# Patient Record
Sex: Female | Born: 1985
Health system: Southern US, Community
[De-identification: ages and names within clinical notes are randomized; demographics above are authoritative.]

## PROBLEM LIST (undated history)

## (undated) ENCOUNTER — Inpatient Hospital Stay: Payer: Self-pay

## (undated) DIAGNOSIS — R519 Headache, unspecified: Secondary | ICD-10-CM

## (undated) DIAGNOSIS — O10919 Unspecified pre-existing hypertension complicating pregnancy, unspecified trimester: Secondary | ICD-10-CM

## (undated) DIAGNOSIS — F419 Anxiety disorder, unspecified: Secondary | ICD-10-CM

## (undated) DIAGNOSIS — I10 Essential (primary) hypertension: Secondary | ICD-10-CM

## (undated) DIAGNOSIS — R51 Headache: Secondary | ICD-10-CM

## (undated) DIAGNOSIS — E119 Type 2 diabetes mellitus without complications: Secondary | ICD-10-CM

## (undated) HISTORY — DX: Essential (primary) hypertension: I10

## (undated) HISTORY — DX: Anxiety disorder, unspecified: F41.9

## (undated) HISTORY — PX: NO PAST SURGERIES: SHX2092

## (undated) HISTORY — DX: Unspecified pre-existing hypertension complicating pregnancy, unspecified trimester: O10.919

## (undated) HISTORY — DX: Type 2 diabetes mellitus without complications: E11.9

---
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
## 2011-10-10 ENCOUNTER — Other Ambulatory Visit (HOSPITAL_COMMUNITY)
Admission: RE | Admit: 2011-10-10 | Discharge: 2011-10-10 | Disposition: A | Discharge status: Home / Self Care | Payer: BC Managed Care – PPO | Source: Ambulatory Visit | Attending: Gynecology | Admitting: Gynecology

## 2011-10-10 ENCOUNTER — Ambulatory Visit (INDEPENDENT_AMBULATORY_CARE_PROVIDER_SITE_OTHER): Payer: BC Managed Care – PPO | Admitting: Gynecology

## 2011-10-10 ENCOUNTER — Encounter: Payer: Self-pay | Admitting: Gynecology

## 2011-10-10 VITALS — BP 132/78 | Ht 64.0 in | Wt 193.0 lb

## 2011-10-10 DIAGNOSIS — R202 Paresthesia of skin: Secondary | ICD-10-CM | POA: Insufficient documentation

## 2011-10-10 DIAGNOSIS — Z23 Encounter for immunization: Secondary | ICD-10-CM

## 2011-10-10 DIAGNOSIS — R319 Hematuria, unspecified: Secondary | ICD-10-CM

## 2011-10-10 DIAGNOSIS — R209 Unspecified disturbances of skin sensation: Secondary | ICD-10-CM

## 2011-10-10 DIAGNOSIS — Z01419 Encounter for gynecological examination (general) (routine) without abnormal findings: Secondary | ICD-10-CM | POA: Insufficient documentation

## 2011-10-10 DIAGNOSIS — G8929 Other chronic pain: Secondary | ICD-10-CM | POA: Insufficient documentation

## 2011-10-10 DIAGNOSIS — R635 Abnormal weight gain: Secondary | ICD-10-CM | POA: Insufficient documentation

## 2011-10-10 DIAGNOSIS — Z113 Encounter for screening for infections with a predominantly sexual mode of transmission: Secondary | ICD-10-CM

## 2011-10-10 DIAGNOSIS — D649 Anemia, unspecified: Secondary | ICD-10-CM

## 2011-10-10 DIAGNOSIS — I10 Essential (primary) hypertension: Secondary | ICD-10-CM

## 2011-10-10 DIAGNOSIS — R51 Headache: Secondary | ICD-10-CM

## 2011-10-10 LAB — COMPREHENSIVE METABOLIC PANEL
ALT: 17 U/L (ref 0–35)
BUN: 10 mg/dL (ref 6–23)
CO2: 25 mEq/L (ref 19–32)
Calcium: 9.8 mg/dL (ref 8.4–10.5)
Chloride: 104 mEq/L (ref 96–112)
Creat: 0.9 mg/dL (ref 0.50–1.10)
Total Bilirubin: 0.4 mg/dL (ref 0.3–1.2)

## 2011-10-10 LAB — CHOLESTEROL, TOTAL: Cholesterol: 128 mg/dL (ref 0–200)

## 2011-10-10 LAB — CBC WITH DIFFERENTIAL/PLATELET
Basophils Relative: 0 % (ref 0–1)
Eosinophils Absolute: 0 10*3/uL (ref 0.0–0.7)
Eosinophils Relative: 0 % (ref 0–5)
Hemoglobin: 12.8 g/dL (ref 12.0–15.0)
Lymphs Abs: 3.1 10*3/uL (ref 0.7–4.0)
MCH: 24.1 pg — ABNORMAL LOW (ref 26.0–34.0)
MCHC: 33 g/dL (ref 30.0–36.0)
MCV: 73.1 fL — ABNORMAL LOW (ref 78.0–100.0)
Monocytes Relative: 9 % (ref 3–12)
Neutrophils Relative %: 61 % (ref 43–77)
Platelets: 291 10*3/uL (ref 150–400)
RBC: 5.31 MIL/uL — ABNORMAL HIGH (ref 3.87–5.11)

## 2011-10-10 LAB — TSH: TSH: 1.105 u[IU]/mL (ref 0.350–4.500)

## 2011-10-10 MED ORDER — NORGESTIM-ETH ESTRAD TRIPHASIC 0.18/0.215/0.25 MG-35 MCG PO TABS: 1.0000 | ORAL_TABLET | Freq: Every day | ORAL | Status: DC
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
## 2011-10-10 NOTE — Patient Instructions (Addendum)
Breast Self-Examination You should begin examining your breasts at age 24 even though the risk for breast cancer is low in this age group. It is important to become familiar with how your breasts look and feel. This is true for pregnant women, nursing mothers, women in menopause and women who have breast implants.  Women should examine their breasts once a month to look for changes and lumps. By doing monthly breast exams, you get to know how your breasts feel and how they can change from month to month. This allows you to pick up changes early. It can also offer you some reassurance that your breast health is good. This exam only takes minutes. Most breast lumps are not caused by cancer. If you find a lump, a special x-ray called a mammogram, or other tests may be needed to determine what is wrong.  Some of the signs that a breast lump is caused by cancer include:  Dimpling of the skin or changes in the shape of the breast or nipple.   A dark-colored or bloody discharge from the nipple.   Swollen lymph glands around the breast or in the armpit.   Redness of the breast or nipple.   Scaly nipple or skin on the breast.   Pain or swelling of the breast.  SELF-EXAM There are a few points to follow when doing a thorough breast exam. The best time to examine your breasts is 5 to 7 days after the menstrual period is over. During menstruation, the breasts are lumpier, and it may be more difficult to pick up changes. If you do not menstruate, have reached menopause or had a hysterectomy, examine your breasts the first day of every month. After three to four months, you will become more familiar with the variations of your breasts and more comfortable with the exam.  Perform your breast exam monthly. Keep a written record with breast changes or normal findings for each breast. This makes it easier to be sure of changes and to not solely depend on memory for size, tenderness, or location. Try to do the exam  at the same time each month, and write down where you are in your menstrual cycle if you are still menstruating.   Look at your breasts. Stand in front of a mirror with your hands clasped behind your head. Tighten your chest muscles and look for asymmetry. This means a difference in shape or contour from one breast to the other, such as puckers, dips or bumps. Look also for skin changes.   Lean forward with your hands on your hips. Again, look for symmetry and skin changes.   While showering, soap the breasts, and carefully feel the breasts with fingertips while holding the arm (on the side of the breast being examined) over the head. Do this with each breast carefully feeling for lumps or changes. Typically, a circular motion with moderate fingertip pressure should be used.   Repeat this exam while lying on your back, again with your arm behind your head and a pillow under your shoulders. Again, use your fingertips to examine both breasts, feeling for lumps and thickening. Begin at 1 o'clock and go clockwise around the whole breast.   At the end of your exam, gently squeeze each nipple to see if there is any drainage. Look for nipple changes, dimpling or redness.   Lastly, examine the upper chest and clavicle areas and in your armpits.  It is not necessary to become alarmed if you find   a breast lump. Most of them are not cancerous. However, it is necessary to see your caregiver if a lump is found in order to have it looked at. Document Released: 04/26/2004 Document Revised: 11/29/2010 Document Reviewed: 07/06/2008 [facility] Patient Information 2012 [place], [place].  Headaches, Frequently Asked Questions MIGRAINE HEADACHES Q: What is migraine? What causes it? How can I treat it? A: Generally, migraine headaches begin as a dull ache. Then they develop into a constant, throbbing, and pulsating pain. You may experience pain at the temples. You may experience pain at the front or back of one or both  sides of the head. The pain is usually accompanied by a combination of:  Nausea.   Vomiting.   Sensitivity to light and noise.  Some people (about 15%) experience an aura (see below) before an attack. The cause of migraine is believed to be chemical reactions in the brain. Treatment for migraine may include over-the-counter or prescription medications. It may also include self-help techniques. These include relaxation training and biofeedback.  Q: What is an aura? A: About 15% of people with migraine get an "aura". This is a sign of neurological symptoms that occur before a migraine headache. You may see wavy or jagged lines, dots, or flashing lights. You might experience tunnel vision or blind spots in one or both eyes. The aura can include visual or auditory hallucinations (something imagined). It may include disruptions in smell (such as strange odors), taste or touch. Other symptoms include:  Numbness.   A "pins and needles" sensation.   Difficulty in recalling or speaking the correct word.  These neurological events may last as long as 60 minutes. These symptoms will fade as the headache begins. Q: What is a trigger? A: Certain physical or environmental factors can lead to or "trigger" a migraine. These include:  Foods.   Hormonal changes.   Weather.   Stress.  It is important to remember that triggers are different for everyone. To help prevent migraine attacks, you need to figure out which triggers affect you. Keep a headache diary. This is a good way to track triggers. The diary will help you talk to your healthcare professional about your condition. Q: Does weather affect migraines? A: Bright sunshine, hot, humid conditions, and drastic changes in barometric pressure may lead to, or "trigger," a migraine attack in some people. But studies have shown that weather does not act as a trigger for everyone with migraines. Q: What is the link between migraine and hormones? A:  Hormones start and regulate many of your body's functions. Hormones keep your body in balance within a constantly changing environment. The levels of hormones in your body are unbalanced at times. Examples are during menstruation, pregnancy, or menopause. That can lead to a migraine attack. In fact, about three quarters of all women with migraine report that their attacks are related to the menstrual cycle.  Q: Is there an increased risk of stroke for migraine sufferers? A: The likelihood of a migraine attack causing a stroke is very remote. That is not to say that migraine sufferers cannot have a stroke associated with their migraines. In persons under age 70, the most common associated factor for stroke is migraine headache. But over the course of a person's normal life span, the occurrence of migraine headache may actually be associated with a reduced risk of dying from cerebrovascular disease due to stroke.  Q: What are acute medications for migraine? A: Acute medications are used to treat the pain of  the headache after it has started. Examples over-the-counter medications, NSAIDs, ergots, and triptans.  Q: What are the triptans? A: Triptans are the newest class of abortive medications. They are specifically targeted to treat migraine. Triptans are vasoconstrictors. They moderate some chemical reactions in the brain. The triptans work on receptors in your brain. Triptans help to restore the balance of a neurotransmitter called serotonin. Fluctuations in levels of serotonin are thought to be a main cause of migraine.  Q: Are over-the-counter medications for migraine effective? A: Over-the-counter, or "OTC," medications may be effective in relieving mild to moderate pain and associated symptoms of migraine. But you should see your caregiver before beginning any treatment regimen for migraine.  Q: What are preventive medications for migraine? A: Preventive medications for migraine are sometimes referred  to as "prophylactic" treatments. They are used to reduce the frequency, severity, and length of migraine attacks. Examples of preventive medications include antiepileptic medications, antidepressants, beta-blockers, calcium channel blockers, and NSAIDs (nonsteroidal anti-inflammatory drugs). Q: Why are anticonvulsants used to treat migraine? A: During the past few years, there has been an increased interest in antiepileptic drugs for the prevention of migraine. They are sometimes referred to as "anticonvulsants". Both epilepsy and migraine may be caused by similar reactions in the brain.  Q: Why are antidepressants used to treat migraine? A: Antidepressants are typically used to treat people with depression. They may reduce migraine frequency by regulating chemical levels, such as serotonin, in the brain.  Q: What alternative therapies are used to treat migraine? A: The term "alternative therapies" is often used to describe treatments considered outside the scope of conventional Western medicine. Examples of alternative therapy include acupuncture, acupressure, and yoga. Another common alternative treatment is herbal therapy. Some herbs are believed to relieve headache pain. Always discuss alternative therapies with your caregiver before proceeding. Some herbal products contain arsenic and other toxins. TENSION HEADACHES Q: What is a tension-type headache? What causes it? How can I treat it? A: Tension-type headaches occur randomly. They are often the result of temporary stress, anxiety, fatigue, or anger. Symptoms include soreness in your temples, a tightening band-like sensation around your head (a "vice-like" ache). Symptoms can also include a pulling feeling, pressure sensations, and contracting head and neck muscles. The headache begins in your forehead, temples, or the back of your head and neck. Treatment for tension-type headache may include over-the-counter or prescription medications. Treatment  may also include self-help techniques such as relaxation training and biofeedback. CLUSTER HEADACHES Q: What is a cluster headache? What causes it? How can I treat it? A: Cluster headache gets its name because the attacks come in groups. The pain arrives with little, if any, warning. It is usually on one side of the head. A tearing or bloodshot eye and a runny nose on the same side of the headache may also accompany the pain. Cluster headaches are believed to be caused by chemical reactions in the brain. They have been described as the most severe and intense of any headache type. Treatment for cluster headache includes prescription medication and oxygen. SINUS HEADACHES Q: What is a sinus headache? What causes it? How can I treat it? A: When a cavity in the bones of the face and skull (a sinus) becomes inflamed, the inflammation will cause localized pain. This condition is usually the result of an allergic reaction, a tumor, or an infection. If your headache is caused by a sinus blockage, such as an infection, you will probably have a fever. An x-ray  will confirm a sinus blockage. Your caregiver's treatment might include antibiotics for the infection, as well as antihistamines or decongestants.  REBOUND HEADACHES Q: What is a rebound headache? What causes it? How can I treat it? A: A pattern of taking acute headache medications too often can lead to a condition known as "rebound headache." A pattern of taking too much headache medication includes taking it more than 2 days per week or in excessive amounts. That means more than the label or a caregiver advises. With rebound headaches, your medications not only stop relieving pain, they actually begin to cause headaches. Doctors treat rebound headache by tapering the medication that is being overused. Sometimes your caregiver will gradually substitute a different type of treatment or medication. Stopping may be a challenge. Regularly overusing a medication  increases the potential for serious side effects. Consult a caregiver if you regularly use headache medications more than 2 days per week or more than the label advises. ADDITIONAL QUESTIONS AND ANSWERS Q: What is biofeedback? A: Biofeedback is a self-help treatment. Biofeedback uses special equipment to monitor your body's involuntary physical responses. Biofeedback monitors:  Breathing.   Pulse.   Heart rate.   Temperature.   Muscle tension.   Brain activity.  Biofeedback helps you refine and perfect your relaxation exercises. You learn to control the physical responses that are related to stress. Once the technique has been mastered, you do not need the equipment any more. Q: Are headaches hereditary? A: Four out of five (80%) of people that suffer report a family history of migraine. Scientists are not sure if this is genetic or a family predisposition. Despite the uncertainty, a child has a 50% chance of having migraine if one parent suffers. The child has a 75% chance if both parents suffer.  Q: Can children get headaches? A: By the time they reach high school, most young people have experienced some type of headache. Many safe and effective approaches or medications can prevent a headache from occurring or stop it after it has begun.  Q: What type of doctor should I see to diagnose and treat my headache? A: Start with your primary caregiver. Discuss his or her experience and approach to headaches. Discuss methods of classification, diagnosis, and treatment. Your caregiver may decide to recommend you to a headache specialist, depending upon your symptoms or other physical conditions. Having diabetes, allergies, etc., may require a more comprehensive and inclusive approach to your headache. The National Headache Foundation will provide, upon request, a list of [facility] physician members in your state. Document Released: 06/09/2003 Document Revised: 03/08/2011 Document Reviewed:  11/17/2007 [facility] Patient Information 2012 [place], [place].

## 2011-10-11 ENCOUNTER — Telehealth: Payer: Self-pay

## 2011-10-11 LAB — GC/CHLAMYDIA PROBE AMP, GENITAL
Chlamydia, DNA Probe: NEGATIVE
GC Probe Amp, Genital: NEGATIVE

## 2011-10-11 LAB — URINALYSIS W MICROSCOPIC + REFLEX CULTURE
Leukocytes, UA: NEGATIVE
Nitrite: NEGATIVE
Protein, ur: NEGATIVE mg/dL
Squamous Epithelial / LPF: NONE SEEN
Urobilinogen, UA: 0.2 mg/dL (ref 0.0–1.0)

## 2011-10-11 NOTE — Addendum Note (Signed)
Addended by: Venora Maples on: 10/11/2011 03:53 PM   Modules accepted: Orders

## 2011-10-11 NOTE — Telephone Encounter (Signed)
APPT IS FOR 11-19-11 AT 10:15.

## 2011-10-11 NOTE — Telephone Encounter (Signed)
   Ok Edwards, MD << Less Detail     Ok Edwards, MD       Sent: Wed October 10, 2011  5:10 PM    To: Amy Duwaine Maxin, CNA       Brittany White    MRN: [id number] DOB: 12/23/1985    Pt Home: [phone]               Message     Amy, please make appointment for this patient with Dr. Vela Prose neurologist. Please see encountered note for details from today's visit so that we can send him. Thank you

## 2011-10-12 MED ORDER — FLUCONAZOLE 100 MG PO TABS: 100.0000 mg | ORAL_TABLET | Freq: Once | ORAL | Status: AC

## 2011-10-12 NOTE — Addendum Note (Signed)
Addended by: Venora Maples on: 10/12/2011 02:48 PM   Modules accepted: Orders

## 2011-10-12 NOTE — Telephone Encounter (Signed)
PT. NOTIFIED OF APPT. AND RECORDS FAXED TO DR. Clarisse Gouge.

## 2011-10-17 ENCOUNTER — Ambulatory Visit: Payer: BC Managed Care – PPO | Admitting: Gynecology

## 2011-10-17 DIAGNOSIS — R319 Hematuria, unspecified: Secondary | ICD-10-CM

## 2011-10-17 DIAGNOSIS — D649 Anemia, unspecified: Secondary | ICD-10-CM

## 2011-10-17 LAB — IRON AND TIBC
%SAT: 30 % (ref 20–55)
TIBC: 351 ug/dL (ref 250–470)
UIBC: 246 ug/dL (ref 125–400)

## 2011-10-17 LAB — FERRITIN: Ferritin: 98 ng/mL (ref 10–291)

## 2011-10-18 LAB — URINALYSIS W MICROSCOPIC + REFLEX CULTURE
Glucose, UA: NEGATIVE mg/dL
Leukocytes, UA: NEGATIVE
Nitrite: NEGATIVE
Protein, ur: NEGATIVE mg/dL
Urobilinogen, UA: 0.2 mg/dL (ref 0.0–1.0)

## 2011-10-19 ENCOUNTER — Other Ambulatory Visit: Payer: Self-pay | Admitting: Gynecology

## 2011-10-19 MED ORDER — NITROFURANTOIN MONOHYD MACRO 100 MG PO CAPS: 100.0000 mg | ORAL_CAPSULE | Freq: Two times a day (BID) | ORAL | Status: AC

## 2011-10-20 LAB — URINE CULTURE

## 2011-12-04 ENCOUNTER — Other Ambulatory Visit: Payer: Self-pay | Admitting: Neurology

## 2011-12-04 DIAGNOSIS — R200 Anesthesia of skin: Secondary | ICD-10-CM

## 2011-12-12 ENCOUNTER — Ambulatory Visit
Admission: RE | Admit: 2011-12-12 | Discharge: 2011-12-12 | Disposition: A | Discharge status: Home / Self Care | Payer: BC Managed Care – PPO | Source: Ambulatory Visit | Attending: Neurology | Admitting: Neurology

## 2011-12-12 DIAGNOSIS — R200 Anesthesia of skin: Secondary | ICD-10-CM

## 2012-06-04 ENCOUNTER — Emergency Department: Payer: Self-pay

## 2012-06-04 LAB — URINALYSIS, COMPLETE
Bilirubin,UR: NEGATIVE
Leukocyte Esterase: NEGATIVE
Nitrite: NEGATIVE
Ph: 5 (ref 4.5–8.0)
RBC,UR: 2 /HPF (ref 0–5)
Specific Gravity: 1.019 (ref 1.003–1.030)
Squamous Epithelial: 6

## 2012-06-04 LAB — CBC
HGB: 12.7 g/dL (ref 12.0–16.0)
MCH: 23.8 pg — ABNORMAL LOW (ref 26.0–34.0)
MCHC: 31.5 g/dL — ABNORMAL LOW (ref 32.0–36.0)
MCV: 76 fL — ABNORMAL LOW (ref 80–100)
Platelet: 253 10*3/uL (ref 150–440)
RDW: 14.4 % (ref 11.5–14.5)

## 2012-06-04 LAB — BASIC METABOLIC PANEL
Calcium, Total: 8.3 mg/dL — ABNORMAL LOW (ref 8.5–10.1)
Chloride: 109 mmol/L — ABNORMAL HIGH (ref 98–107)
Creatinine: 0.91 mg/dL (ref 0.60–1.30)
EGFR (African American): >60
Sodium: 138 mmol/L (ref 136–145)

## 2012-06-04 LAB — WET PREP, GENITAL

## 2013-01-29 ENCOUNTER — Telehealth: Payer: Self-pay | Admitting: Internal Medicine

## 2013-01-29 NOTE — Telephone Encounter (Signed)
Pt is scheduled for a new pt apptmt w/you on 02/10/2013, but is having some stomach pain, vomiting, and fatigue.  She hasn't had a menstrual in 8 months due to her birth control.  However, she does suspect poss pregnancy.  She's taken an OTC pregnancy test which was negative, but due to her current symptoms, she's wondering if it was correct or not.  She wants to be seen sooner than 02/10/2013.  Can you accommodate her an apptmt prior to 11/11? Thank you.

## 2013-01-29 NOTE — Telephone Encounter (Signed)
Can she do 11/4 at 2:00 that is the only time I can work her in

## 2013-01-29 NOTE — Telephone Encounter (Signed)
Have her make and acute only visit and I will do her new pt stuff on the 11th

## 2013-01-29 NOTE — Telephone Encounter (Signed)
Pt says that means 2 copays and it also means time away from work, which would cause financial hardship for her.  She wants to know if you can do everything in one visit prior to 02/10/2013. Can you accommodate this? Thank you.

## 2013-01-30 NOTE — Telephone Encounter (Signed)
Pt scheduled for 02/03/2013 @ 2:00 p.m.

## 2013-02-03 ENCOUNTER — Encounter: Payer: Self-pay | Admitting: Internal Medicine

## 2013-02-03 ENCOUNTER — Ambulatory Visit (INDEPENDENT_AMBULATORY_CARE_PROVIDER_SITE_OTHER): Payer: Managed Care, Other (non HMO) | Admitting: Internal Medicine

## 2013-02-03 VITALS — BP 112/70 | HR 74 | Temp 98.1°F | Ht 64.0 in | Wt 215.8 lb

## 2013-02-03 DIAGNOSIS — R635 Abnormal weight gain: Secondary | ICD-10-CM

## 2013-02-03 DIAGNOSIS — R5381 Other malaise: Secondary | ICD-10-CM

## 2013-02-03 DIAGNOSIS — Z0000 Encounter for general adult medical examination without abnormal findings: Secondary | ICD-10-CM

## 2013-02-03 DIAGNOSIS — Z13220 Encounter for screening for lipoid disorders: Secondary | ICD-10-CM

## 2013-02-03 DIAGNOSIS — G43909 Migraine, unspecified, not intractable, without status migrainosus: Secondary | ICD-10-CM

## 2013-02-03 DIAGNOSIS — G44209 Tension-type headache, unspecified, not intractable: Secondary | ICD-10-CM | POA: Insufficient documentation

## 2013-02-03 DIAGNOSIS — R110 Nausea: Secondary | ICD-10-CM

## 2013-02-03 LAB — LIPID PANEL
Cholesterol: 133 mg/dL (ref 0–200)
LDL Cholesterol: 81 mg/dL (ref 0–99)
Total CHOL/HDL Ratio: 3
Triglycerides: 48 mg/dL (ref 0.0–149.0)

## 2013-02-03 LAB — COMPREHENSIVE METABOLIC PANEL
ALT: 22 U/L (ref 0–35)
Albumin: 4.1 g/dL (ref 3.5–5.2)
Alkaline Phosphatase: 95 U/L (ref 39–117)
BUN: 8 mg/dL (ref 6–23)
Calcium: 9.2 mg/dL (ref 8.4–10.5)
Chloride: 105 mEq/L (ref 96–112)
Glucose, Bld: 127 mg/dL — ABNORMAL HIGH (ref 70–99)
Potassium: 4 mEq/L (ref 3.5–5.1)
Total Protein: 7.4 g/dL (ref 6.0–8.3)

## 2013-02-03 LAB — CBC
Hemoglobin: 12.3 g/dL (ref 12.0–15.0)
MCV: 74.2 fl — ABNORMAL LOW (ref 78.0–100.0)
Platelets: 266 10*3/uL (ref 150.0–400.0)
RBC: 5.1 Mil/uL (ref 3.87–5.11)

## 2013-02-03 MED ORDER — IBUPROFEN 800 MG PO TABS: 800.0000 mg | ORAL_TABLET | Freq: Three times a day (TID) | ORAL | Status: DC | PRN

## 2013-02-03 NOTE — Assessment & Plan Note (Signed)
Hormonal Better on OCP Will refill Advil today

## 2013-02-03 NOTE — Patient Instructions (Signed)

## 2013-02-03 NOTE — Progress Notes (Signed)
HPI Pt presents to the clinic today to establish care. [facility]. She had a physical in 05/2012. She does have some concerns today. 1- She c/o fatigue, excessive sleepiness. This has been going on for the last month. She is not sleeping with TV, music or nightlight on. She has never had issues with sleep in the past. She has not tried anything OTC.  2- She c/o intermittent nausea. This started about 2 weeks. She has vomited, clear fluid. Typically, this occurs around 10 am or 11. She denies any specific triggers. 3- she c/o of excessive appetite. This started about 2 months ago. She has gained 5-6 lb weight gain. She is not working. 4- She is concerned that she may be pregnant. She is on [place]. She has been taking it for the last 8 months. She has not had a period since that time. She has not missed any pills. She has done urine preg test OTC which have been negative. She is sexually active.  Flu: never HPV: has had 2 out of 3 (2013) Tetanus: unsure LMP: 05/2012 Pap Smear: 05/2012 Dentist: biannually  Past Medical History  Diagnosis Date  . Anxiety   . Hypertension     Current Outpatient Prescriptions  Medication Sig Dispense Refill  . ibuprofen (ADVIL,MOTRIN) 800 MG tablet Take 800 mg by mouth every 8 (eight) hours as needed.      . Norgestimate-Ethinyl Estradiol Triphasic (ORTHO TRI-CYCLEN, 28,) 0.18/0.215/0.25 MG-35 MCG tablet Take 1 tablet by mouth daily.  1 Package  11   No current facility-administered medications for this visit.    No Known Allergies  Family History  Problem Relation Age of Onset  . Heart disease Maternal Aunt   . Heart disease Maternal Grandmother   . Hypertension Paternal Grandmother   . Heart disease Paternal Grandmother   . Diabetes Paternal Grandmother   . Hypertension Paternal Grandfather   . Cancer Paternal Grandfather     PROSTATE    History   Social History  . Marital Status: Single    Spouse Name:  N/A    Number of Children: N/A  . Years of Education: N/A   Occupational History  . Not on file.   Social History Main Topics  . Smoking status: Never Smoker   . Smokeless tobacco: Never Used  . Alcohol Use: No  . Drug Use: No  . Sexual Activity: Yes    Birth Control/ Protection: Pill     Comment: ORTHO TRI CYLCLEN   Other Topics Concern  . Not on file   Social History Narrative  . No narrative on file    ROS:  Constitutional: Denies fever, malaise, headache or abrupt weight changes.  HEENT: Denies eye pain, eye redness, ear pain, ringing in the ears, wax buildup, runny nose, nasal congestion, bloody nose, or sore throat. Respiratory: Denies difficulty breathing, shortness of breath, cough or sputum production.   Cardiovascular: Denies chest pain, chest tightness, palpitations or swelling in the hands or feet.  Gastrointestinal: Denies abdominal pain, bloating, constipation, diarrhea or blood in the stool.  GU: Denies frequency, urgency, pain with urination, blood in urine, odor or discharge. Musculoskeletal: Denies decrease in range of motion, difficulty with gait, muscle pain or joint pain and swelling.  Skin: Denies redness, rashes, lesions or ulcercations.  Neurological: Denies dizziness, difficulty with memory, difficulty with speech or problems with balance and coordination.   No other specific complaints in a complete review of systems (except as listed  in HPI above).  PE:  BP 112/70  Pulse 74  Temp(Src) 98.1 F (36.7 C) (Oral)  Ht 5\' 4"  (1.626 m)  Wt 215 lb 12 oz (97.864 kg)  BMI 37.02 kg/m2  SpO2 99%  LMP 05/02/2012 Wt Readings from Last 3 Encounters:  02/03/13 215 lb 12 oz (97.864 kg)  10/10/11 193 lb (87.544 kg)    General: Appears her stated age, obese but well developed, well nourished in NAD. HEENT: Head: normal shape and size; Eyes: sclera white, no icterus, conjunctiva pink, PERRLA and EOMs intact; Ears: Tm's gray and intact, normal light reflex;  Nose: mucosa pink and moist, septum midline; Throat/Mouth: Teeth present, mucosa pink and moist, no lesions or ulcerations noted.  Neck: Normal range of motion. Neck supple, trachea midline. No massses, lumps or thyromegaly present.  Cardiovascular: Normal rate and rhythm. S1,S2 noted.  No murmur, rubs or gallops noted. No JVD or BLE edema. No carotid bruits noted. Pulmonary/Chest: Normal effort and positive vesicular breath sounds. No respiratory distress. No wheezes, rales or ronchi noted.  Abdomen: Soft and nontender. Normal bowel sounds, no bruits noted. No distention or masses noted. Liver, spleen and kidneys non palpable. Musculoskeletal: Normal range of motion. No signs of joint swelling. No difficulty with gait.  Neurological: Alert and oriented. Cranial nerves II-XII intact. Coordination normal. +DTRs bilaterally. Psychiatric: Mood and affect normal. Behavior is normal. Judgment and thought content normal.   EKG:  BMET    Component Value Date/Time   NA 139 10/10/2011 1610   K 3.9 10/10/2011 1610   CL 104 10/10/2011 1610   CO2 25 10/10/2011 1610   GLUCOSE 71 10/10/2011 1610   BUN 10 10/10/2011 1610   CREATININE 0.90 10/10/2011 1610   CALCIUM 9.8 10/10/2011 1610    Lipid Panel     Component Value Date/Time   CHOL 128 10/10/2011 1610    CBC    Component Value Date/Time   WBC 10.2 10/10/2011 1610   RBC 5.31* 10/10/2011 1610   HGB 12.8 10/10/2011 1610   HCT 38.8 10/10/2011 1610   PLT 291 10/10/2011 1610   MCV 73.1* 10/10/2011 1610   MCH 24.1* 10/10/2011 1610   MCHC 33.0 10/10/2011 1610   RDW 15.1 10/10/2011 1610   LYMPHSABS 3.1 10/10/2011 1610   MONOABS 0.9 10/10/2011 1610   EOSABS 0.0 10/10/2011 1610   BASOSABS 0.0 10/10/2011 1610    Hgb A1C No results found for this basename: HGBA1C     Assessment and Plan:  Preventative Health Maintenance:  Pt declines flu shot and Tdap today Work on diet and exercise- this will improve your energy and decrease your fatigue Will check  basic labs today including TSH for fatigue, weight gain and nausea Pt declines RX for antiemetic at this time Urine Hcg negative  RTC in 1 year or sooner if needed

## 2013-02-04 ENCOUNTER — Telehealth: Payer: Self-pay

## 2013-02-04 NOTE — Telephone Encounter (Signed)
Pt request cb when test results available or pt prefers a [employer].

## 2013-02-05 ENCOUNTER — Other Ambulatory Visit: Payer: Self-pay | Admitting: Internal Medicine

## 2013-02-05 ENCOUNTER — Encounter: Payer: Self-pay | Admitting: Internal Medicine

## 2013-02-05 ENCOUNTER — Other Ambulatory Visit: Payer: Self-pay

## 2013-02-05 ENCOUNTER — Ambulatory Visit: Payer: BC Managed Care – PPO | Admitting: Internal Medicine

## 2013-02-05 DIAGNOSIS — R7989 Other specified abnormal findings of blood chemistry: Secondary | ICD-10-CM

## 2013-02-05 NOTE — Telephone Encounter (Signed)
Will release via mychart when available

## 2013-02-05 NOTE — Telephone Encounter (Signed)
Pt got lab results from mychart and pt wants to know what is going on with her body; pt does not want to wait 4-6 weeks to see if could be viral infection or underactive thyroid; pt wants to know what can be done now instead of waiting to see if pt gets worse. [facility] [address] Please advise.

## 2013-02-05 NOTE — Telephone Encounter (Signed)
Pt left vm requesting cb today about lab results. Left v/m for pt to cb.

## 2013-02-05 NOTE — Telephone Encounter (Signed)
We cannot treat her until we know if this lab is a true reading. She can repeat the labs but she needs to wait at least 7-10 days. Labs have already been ordered

## 2013-02-06 NOTE — Telephone Encounter (Signed)
Spoke with pt and advised as instructed. Pt said she will give it another week and then will come in for lab. Pt will cb for lab appt. Advised pt if her condition were to change or worsen or have questions to call office back. Pt voiced understanding.

## 2013-02-10 ENCOUNTER — Ambulatory Visit: Payer: BC Managed Care – PPO | Admitting: Internal Medicine

## 2013-05-08 ENCOUNTER — Ambulatory Visit: Payer: Managed Care, Other (non HMO) | Admitting: Family Medicine

## 2013-05-20 ENCOUNTER — Encounter: Payer: Self-pay | Admitting: Internal Medicine

## 2013-05-20 DIAGNOSIS — G43909 Migraine, unspecified, not intractable, without status migrainosus: Secondary | ICD-10-CM

## 2013-05-20 MED ORDER — IBUPROFEN 800 MG PO TABS: 800.0000 mg | ORAL_TABLET | Freq: Three times a day (TID) | ORAL | Status: DC | PRN

## 2013-05-29 ENCOUNTER — Ambulatory Visit: Payer: Managed Care, Other (non HMO) | Admitting: Internal Medicine

## 2013-08-26 ENCOUNTER — Ambulatory Visit (INDEPENDENT_AMBULATORY_CARE_PROVIDER_SITE_OTHER): Payer: BC Managed Care – PPO | Admitting: Internal Medicine

## 2013-08-26 ENCOUNTER — Encounter: Payer: Self-pay | Admitting: Internal Medicine

## 2013-08-26 VITALS — BP 126/82 | HR 108 | Temp 98.7°F | Wt 227.5 lb

## 2013-08-26 DIAGNOSIS — M545 Low back pain, unspecified: Secondary | ICD-10-CM

## 2013-08-26 DIAGNOSIS — R300 Dysuria: Secondary | ICD-10-CM

## 2013-08-26 DIAGNOSIS — E669 Obesity, unspecified: Secondary | ICD-10-CM

## 2013-08-26 DIAGNOSIS — N390 Urinary tract infection, site not specified: Secondary | ICD-10-CM

## 2013-08-26 LAB — POCT URINALYSIS DIPSTICK
Glucose, UA: NEGATIVE
Ketones, UA: NEGATIVE
SPEC GRAV UA: 1.01
UROBILINOGEN UA: 0.2
pH, UA: 7

## 2013-08-26 MED ORDER — PHENTERMINE HCL 37.5 MG PO CAPS: 37.5000 mg | ORAL_CAPSULE | ORAL | Status: DC

## 2013-08-26 MED ORDER — CIPROFLOXACIN HCL 500 MG PO TABS: 500.0000 mg | ORAL_TABLET | Freq: Two times a day (BID) | ORAL | Status: DC

## 2013-08-26 NOTE — Progress Notes (Signed)
Subjective:    Patient ID: [facility], female    DOB: May 07, 1985, 28 y.o.   MRN: [id number]  HPI  Pt presents to the clinic today with c/o swelling in her hands and feet. This has been intermittent over the last 10 days. She has not had a high salt intake. She has gained 12.5 pounds over the last 6 months. She reports she is taking in 1700 calories per day but she is exercising as well. She wants to lose weight before she tries to get pregnant. She has heard about adipex and would like to try that.  She also c/o low back pain. This started about 4 days ago.She reports that she did have a yeast infection. She did use monistat OTC with relief of all of her symptoms except low back pain.   Review of Systems      Past Medical History  Diagnosis Date  . Anxiety   . Hypertension     Current Outpatient Prescriptions  Medication Sig Dispense Refill  . ibuprofen (ADVIL,MOTRIN) 800 MG tablet Take 1 tablet (800 mg total) by mouth every 8 (eight) hours as needed.  30 tablet  1   No current facility-administered medications for this visit.    No Known Allergies  Family History  Problem Relation Age of Onset  . Heart disease Maternal Aunt   . Heart disease Maternal Grandmother   . Hypertension Paternal Grandmother   . Heart disease Paternal Grandmother   . Diabetes Paternal Grandmother   . Hypertension Paternal Grandfather   . Cancer Paternal Grandfather     PROSTATE    History   Social History  . Marital Status: Single    Spouse Name: N/A    Number of Children: N/A  . Years of Education: N/A   Occupational History  . Not on file.   Social History Main Topics  . Smoking status: Never Smoker   . Smokeless tobacco: Never Used  . Alcohol Use: No  . Drug Use: No  . Sexual Activity: Yes    Birth Control/ Protection: Pill     Comment: ORTHO TRI CYLCLEN   Other Topics Concern  . Not on file   Social History Narrative  . No narrative on file      Constitutional: Denies fever, malaise, fatigue, headache or abrupt weight changes.  Respiratory: Denies difficulty breathing, shortness of breath, cough or sputum production.   Cardiovascular: Pt reports swelling in hands and feet. Denies chest pain, chest tightness, palpitations GU: Denies urgency, frequency, pain with urination, burning sensation, blood in urine, odor or discharge. Musculoskeletal: Pt reports low back pain. Denies decrease in range of motion, difficulty with gait, muscle pain or joint pain and swelling.   No other specific complaints in a complete review of systems (except as listed in HPI above).  Objective:   Physical Exam  BP 126/82  Pulse 108  Temp(Src) 98.7 F (37.1 C) (Oral)  Wt 227 lb 8 oz (103.193 kg)  SpO2 99%  LMP 08/07/2013 Wt Readings from Last 3 Encounters:  08/26/13 227 lb 8 oz (103.193 kg)  02/03/13 215 lb 12 oz (97.864 kg)  10/10/11 193 lb (87.544 kg)    General: Appears her stated age, obese but well developed, well nourished in NAD. Cardiovascular: Normal rate and rhythm. S1,S2 noted.  No murmur, rubs or gallops noted. No JVD or BLE edema. No carotid bruits noted. Pulmonary/Chest: Normal effort and positive vesicular breath sounds. No respiratory distress. No wheezes, rales or ronchi  noted.  Abdomen: Soft and nontender. Normal bowel sounds, no bruits noted. No distention or masses noted. Liver, spleen and kidneys non palpable.  BMET    Component Value Date/Time   NA 137 02/03/2013 1444   K 4.0 02/03/2013 1444   CL 105 02/03/2013 1444   CO2 26 02/03/2013 1444   GLUCOSE 127* 02/03/2013 1444   BUN 8 02/03/2013 1444   CREATININE 0.9 02/03/2013 1444   CREATININE 0.90 10/10/2011 1610   CALCIUM 9.2 02/03/2013 1444    Lipid Panel     Component Value Date/Time   CHOL 133 02/03/2013 1444   TRIG 48.0 02/03/2013 1444   HDL 42.20 02/03/2013 1444   CHOLHDL 3 02/03/2013 1444   VLDL 9.6 02/03/2013 1444   LDLCALC 81 02/03/2013 1444    CBC     Component Value Date/Time   WBC 8.7 02/03/2013 1444   RBC 5.10 02/03/2013 1444   HGB 12.3 02/03/2013 1444   HCT 37.9 02/03/2013 1444   PLT 266.0 02/03/2013 1444   MCV 74.2* 02/03/2013 1444   MCH 24.1* 10/10/2011 1610   MCHC 32.3 02/03/2013 1444   RDW 14.8* 02/03/2013 1444   LYMPHSABS 3.1 10/10/2011 1610   MONOABS 0.9 10/10/2011 1610   EOSABS 0.0 10/10/2011 1610   BASOSABS 0.0 10/10/2011 1610    Hgb A1C Lab Results  Component Value Date   HGBA1C 6.4 02/03/2013         Assessment & Plan:   Low back pain:  Urinalysis: small leuks, mod blood Will send urine culture eRx for cipro 500 mg BID x 5 days  Obesity:  No evidence of swelling- I think the swelling she sees is related to her weight gain Discussed diet and exercise, use of myfitnesspal app on the smart phone Information given on diet and weight loss Discussed risks and benefits of phentermine- she agrees to proceed with medication  RTC in 1 month for weight check

## 2013-08-26 NOTE — Patient Instructions (Addendum)

## 2013-08-26 NOTE — Progress Notes (Signed)
Pre visit review using our clinic review tool, if applicable. No additional management support is needed unless otherwise documented below in the visit note. 

## 2013-08-27 LAB — URINE CULTURE: Colony Count: 4000

## 2013-09-01 ENCOUNTER — Encounter: Payer: Self-pay | Admitting: Internal Medicine

## 2013-09-16 ENCOUNTER — Encounter: Payer: Self-pay | Admitting: Internal Medicine

## 2013-09-25 ENCOUNTER — Encounter: Payer: Self-pay | Admitting: Internal Medicine

## 2013-09-25 ENCOUNTER — Ambulatory Visit (INDEPENDENT_AMBULATORY_CARE_PROVIDER_SITE_OTHER): Payer: BC Managed Care – PPO | Admitting: Internal Medicine

## 2013-09-25 VITALS — BP 134/76 | HR 75 | Temp 98.7°F | Wt 220.5 lb

## 2013-09-25 DIAGNOSIS — N939 Abnormal uterine and vaginal bleeding, unspecified: Secondary | ICD-10-CM

## 2013-09-25 DIAGNOSIS — E669 Obesity, unspecified: Secondary | ICD-10-CM

## 2013-09-25 DIAGNOSIS — N926 Irregular menstruation, unspecified: Secondary | ICD-10-CM

## 2013-09-25 DIAGNOSIS — E66811 Obesity, class 1: Secondary | ICD-10-CM | POA: Insufficient documentation

## 2013-09-25 LAB — POCT URINALYSIS DIPSTICK
Bilirubin, UA: NEGATIVE
Glucose, UA: NEGATIVE
Ketones, UA: NEGATIVE
Leukocytes, UA: NEGATIVE
Nitrite, UA: NEGATIVE
PH UA: 6
PROTEIN UA: NEGATIVE
SPEC GRAV UA: 1.015
UROBILINOGEN UA: 0.2

## 2013-09-25 LAB — POCT URINE PREGNANCY: Preg Test, Ur: NEGATIVE

## 2013-09-25 MED ORDER — PHENTERMINE HCL 37.5 MG PO CAPS: 37.5000 mg | ORAL_CAPSULE | ORAL | Status: DC

## 2013-09-25 NOTE — Assessment & Plan Note (Signed)
lost 7 lbs on phentermine Discussed importance of diet and exercise Medication refilled today

## 2013-09-25 NOTE — Progress Notes (Signed)
Subjective:    Patient ID: [facility], female    DOB: [birth year], 28 y.o.   MRN: [id number]  HPI  Pt presents to the clinic today for 1 month follow up and weight check. She was started on phentermine 08/26/13. Since that time she has lost 7 lbs. She is tolerating the medication well without side effects. She is eating 6 smalls meals per day. She is incorporating more fruit with diet. She is exercising 4 days out of the week.  Additionally, she does report not having a period since 08/07/13. She was not actively trying to get pregnant. She denies breast tenderness, nausea or weight gain.   Review of Systems      Past Medical History  Diagnosis Date  . Anxiety   . Hypertension     Current Outpatient Prescriptions  Medication Sig Dispense Refill  . ibuprofen (ADVIL,MOTRIN) 800 MG tablet Take 1 tablet (800 mg total) by mouth every 8 (eight) hours as needed.  30 tablet  1  . phentermine 37.5 MG capsule Take 1 capsule (37.5 mg total) by mouth every morning.  30 capsule  0   No current facility-administered medications for this visit.    No Known Allergies  Family History  Problem Relation Age of Onset  . Heart disease Maternal Aunt   . Heart disease Maternal Grandmother   . Hypertension Paternal Grandmother   . Heart disease Paternal Grandmother   . Diabetes Paternal Grandmother   . Hypertension Paternal Grandfather   . Cancer Paternal Grandfather     PROSTATE    History   Social History  . Marital Status: Single    Spouse Name: N/A    Number of Children: N/A  . Years of Education: N/A   Occupational History  . Not on file.   Social History Main Topics  . Smoking status: Never Smoker   . Smokeless tobacco: Never Used  . Alcohol Use: No  . Drug Use: No  . Sexual Activity: Yes    Birth Control/ Protection: Pill     Comment: ORTHO TRI CYLCLEN   Other Topics Concern  . Not on file   Social History Narrative  . No narrative on file      Constitutional: Denies fever, malaise, fatigue, headache or abrupt weight changes.  Respiratory: Denies difficulty breathing, shortness of breath, cough or sputum production.   Cardiovascular: Denies chest pain, chest tightness, palpitations or swelling in the hands or feet.  GU: Pt reports lack of period.Denies urgency, frequency, pain with urination, burning sensation, blood in urine, odor or discharge. Neurological: Denies dizziness, difficulty with memory, difficulty with speech or problems with balance and coordination.   No other specific complaints in a complete review of systems (except as listed in HPI above).  Objective:   Physical Exam   BP 134/76  Pulse 75  Temp(Src) 98.7 F (37.1 C) (Oral)  Wt 220 lb 8 oz (100.018 kg)  SpO2 98%  LMP 08/07/2013 Wt Readings from Last 3 Encounters:  09/25/13 220 lb 8 oz (100.018 kg)  08/26/13 227 lb 8 oz (103.193 kg)  02/03/13 215 lb 12 oz (97.864 kg)    General: Appears her stated age, well developed, well nourished in NAD. Cardiovascular: Normal rate and rhythm. S1,S2 noted.  No murmur, rubs or gallops noted. No JVD or BLE edema. No carotid bruits noted. Pulmonary/Chest: Normal effort and positive vesicular breath sounds. No respiratory distress. No wheezes, rales or ronchi noted.  Abdomen: Soft and nontender. Normal bowel  sounds, no bruits noted. No distention or masses noted. Liver, spleen and kidneys non palpable. Neurological: Alert and oriented. Cranial nerves II-XII intact. Coordination normal. +DTRs bilaterally.  BMET    Component Value Date/Time   NA 137 02/03/2013 1444   K 4.0 02/03/2013 1444   CL 105 02/03/2013 1444   CO2 26 02/03/2013 1444   GLUCOSE 127* 02/03/2013 1444   BUN 8 02/03/2013 1444   CREATININE 0.9 02/03/2013 1444   CREATININE 0.90 10/10/2011 1610   CALCIUM 9.2 02/03/2013 1444    Lipid Panel     Component Value Date/Time   CHOL 133 02/03/2013 1444   TRIG 48.0 02/03/2013 1444   HDL 42.20 02/03/2013 1444    CHOLHDL 3 02/03/2013 1444   VLDL 9.6 02/03/2013 1444   LDLCALC 81 02/03/2013 1444    CBC    Component Value Date/Time   WBC 8.7 02/03/2013 1444   RBC 5.10 02/03/2013 1444   HGB 12.3 02/03/2013 1444   HCT 37.9 02/03/2013 1444   PLT 266.0 02/03/2013 1444   MCV 74.2* 02/03/2013 1444   MCH 24.1* 10/10/2011 1610   MCHC 32.3 02/03/2013 1444   RDW 14.8* 02/03/2013 1444   LYMPHSABS 3.1 10/10/2011 1610   MONOABS 0.9 10/10/2011 1610   EOSABS 0.0 10/10/2011 1610   BASOSABS 0.0 10/10/2011 1610    Hgb A1C Lab Results  Component Value Date   HGBA1C 6.4 02/03/2013        Assessment & Plan:   Amenorrhea:  Urinalysis: large blood Urine HCG: negative Discussed importance of not getting pregnant while on phentermine

## 2013-09-25 NOTE — Progress Notes (Signed)
Pre visit review using our clinic review tool, if applicable. No additional management support is needed unless otherwise documented below in the visit note. 

## 2013-09-25 NOTE — Patient Instructions (Signed)
Exercise to Lose Weight Exercise and a healthy diet may help you lose weight. Your doctor may suggest specific exercises. EXERCISE IDEAS AND TIPS  Choose low-cost things you enjoy doing, such as walking, bicycling, or exercising to workout videos.  Take stairs instead of the elevator.  Walk during your lunch break.  Park your car further away from work or school.  Go to a gym or an exercise class.  Start with 5 to 10 minutes of exercise each day. Build up to 30 minutes of exercise 4 to 6 days a week.  Wear shoes with good support and comfortable clothes.  Stretch before and after working out.  Work out until you breathe harder and your heart beats faster.  Drink extra water when you exercise.  Do not do so much that you hurt yourself, feel dizzy, or get very short of breath. Exercises that burn about 150 calories:  Running 1  miles in 15 minutes.  Playing volleyball for 45 to 60 minutes.  Washing and waxing a car for 45 to 60 minutes.  Playing touch football for 45 minutes.  Walking 1  miles in 35 minutes.  Pushing a stroller 1  miles in 30 minutes.  Playing basketball for 30 minutes.  Raking leaves for 30 minutes.  Bicycling 5 miles in 30 minutes.  Walking 2 miles in 30 minutes.  Dancing for 30 minutes.  Shoveling snow for 15 minutes.  Swimming laps for 20 minutes.  Walking up stairs for 15 minutes.  Bicycling 4 miles in 15 minutes.  Gardening for 30 to 45 minutes.  Jumping rope for 15 minutes.  Washing windows or floors for 45 to 60 minutes. Document Released: 04/21/2010 Document Revised: 06/11/2011 Document Reviewed: 04/21/2010 Exit[employer] Patient Information 2015 [employer]. This information is not intended to replace advice given to you by your health care provider. Make sure you discuss any questions you have with your health care provider.  

## 2013-10-07 ENCOUNTER — Ambulatory Visit: Payer: Managed Care, Other (non HMO) | Admitting: Internal Medicine

## 2013-10-23 ENCOUNTER — Ambulatory Visit: Payer: BC Managed Care – PPO | Admitting: Internal Medicine

## 2013-11-13 ENCOUNTER — Ambulatory Visit: Payer: BC Managed Care – PPO | Admitting: Internal Medicine

## 2013-11-13 DIAGNOSIS — Z0289 Encounter for other administrative examinations: Secondary | ICD-10-CM

## 2014-04-02 NOTE — L&D Delivery Note (Signed)
Delivery Summary for [facility]  Labor Events:   Preterm labor:   Rupture date:   Rupture time:   Rupture type: Intact  Fluid Color: Clear  Induction:   Augmentation:   Complications:   Cervical ripening:          Delivery: C/S; Non reassuring FHR Tracing  Episiotomy:   Lacerations:   Repair suture:   Repair # of packets:   Blood loss (ml): 750 ml   Information for the patient's newborn:  Harden MoYancey, Girl Hayslee [[id number]][[id number]]    Delivery 03/23/2015 7:35 PM by  C-Section, Low Transverse Sex:  female Gestational Age: 741w6d Delivery Clinician:  Daphine DeutscherMartin A Shameer Molstad Living?: Yes        APGARS  One minute Five minutes Ten minutes  Skin color: 0   1      Heart rate: 2   2      Grimace: 2   2      Muscle tone: 2   2      Breathing: 2   2      Totals: 8  9      Presentation/position:      Resuscitation: None  Cord information: 3 vessels   Disposition of cord blood: No    Blood gases sent? Yes Complications: None  Placenta: Delivered: 03/23/2015 7:36 PM  Manual removal Pathology  Intact appearance Newborn Measurements: Weight:    Height:    Head circumference:    Chest circumference:    Other providers: Registered Nurse Registered Nurse Registered Nurse Leotis Shamesina M Braimah Heather F Creasey Cyndi BenderSusan R Matthews  Additional  information: Forceps:   Vacuum:   Breech:   Observed anomalies

## 2014-04-03 ENCOUNTER — Other Ambulatory Visit: Payer: Self-pay | Admitting: Internal Medicine

## 2014-04-07 ENCOUNTER — Ambulatory Visit (INDEPENDENT_AMBULATORY_CARE_PROVIDER_SITE_OTHER): Payer: 59 | Admitting: Internal Medicine

## 2014-04-07 ENCOUNTER — Encounter: Payer: Self-pay | Admitting: Internal Medicine

## 2014-04-07 VITALS — BP 118/76 | HR 85 | Temp 98.2°F | Wt 218.5 lb

## 2014-04-07 DIAGNOSIS — E669 Obesity, unspecified: Secondary | ICD-10-CM

## 2014-04-07 DIAGNOSIS — G44209 Tension-type headache, unspecified, not intractable: Secondary | ICD-10-CM

## 2014-04-07 MED ORDER — PHENTERMINE HCL 37.5 MG PO CAPS: 37.5000 mg | ORAL_CAPSULE | ORAL | Status: DC

## 2014-04-07 MED ORDER — IBUPROFEN 800 MG PO TABS: 800.0000 mg | ORAL_TABLET | Freq: Three times a day (TID) | ORAL | Status: DC | PRN

## 2014-04-07 NOTE — Assessment & Plan Note (Signed)
Will restart phentermine 1500 calorie plan given She has a workout regime

## 2014-04-07 NOTE — Patient Instructions (Signed)
Pre-Exercise Meal A pre-exercise meal is the meal that one chooses to eat before training or competition. This meal has the ability to affect performance. The nutrients in your pre-exercise meal will sustain your energy throughout your training session or competition. Eating the correct types of foods can optimize your performance. Pre-exercise meals will differ for every athlete. In planning this meal consider any special health issues, such as:  Hypoglycemia.  Runner's diarrhea. All pre-exercise meals should include:  Complex carbohydrates.  An adequate amount of fluid. The caloric intake should reflect the amount of time between the meal and the competition. Meals that are consumed 3 to 4 hours before competition should have a higher caloric content than those eaten 1 to 2 hours before competition. Also if the competition lasts for more than 1 hour you may want to consider eating more carbohydrates during the event and not rely solely on the pre-exercise meal. If a pre-exercise meal has too few calories, then you may become hungry and performance may decline. SPECIFIC CONCERNS  Fiber Content: Fiber is important for the digestive tract. If the pre-exercise meal contains foods that are too high in fiber, then the athlete may have abdominal pain, nausea, or bloating. The greater the exercise intensity, the more likely this is to occur.  Sodium Content: Sodium is a mineral that helps the body maintain fluid balance. During shorter events, salty foods may increase your thirst. However, if the events last longer than 4 hours, the sodium intake may be important. Over time a low sodium level may decrease performance. Vegetables: Certain vegetables cause the body to increase gas formation. These vegetables include onions and cabbage. These foods should be avoided in the pre-exercise meal. They may lead to bloating or abdominal discomfort. Protein Content: Protein should be avoided in the pre-exercise meal.  It slows the emptying of the stomach and may cause abdominal discomfort. Fluid Content: Fluid is important in the pre-exercise meal. Liquid foods can be taken with the pre-exercise meal, especially if the athlete has a tendency to experience an urge to go to the bathroom while exercising. These fluids will not prevent dehydration during competition. Dehydration is prevented by planned intake of fluids throughout training.  Fat Content: Fat should be minimized before competition. It slows stomach emptying and can contribute to bloating.  Document Released: 03/19/2005 Document Revised: 06/11/2011 Document Reviewed: 07/01/2008 [facility]ExitCare Patient Information 2015 [place], [place]. This information is not intended to replace advice given to you by your health care provider. Make sure you discuss any questions you have with your health care provider.

## 2014-04-07 NOTE — Progress Notes (Signed)
Subjective:    Patient ID: Brittany White, female    DOB: [birth year], 29 y.o.   MRN: [id number]  HPI  Pt presents to the clinic today for medication refills.  Tension headaches: Has only has 2 headaches over the last month. They occur at her temples and spread across her forehead. She reports it feels like a tight squeezing sensation. She denies nausea, blurred vision, dizziness or an aura with these headaches. The ibuprofen helps.  Obesity: She was on phentermine for 2 months last year. She was able to lose about 9 lbs while on the medication. She has not gained any of that weight back. She would like to restart the medication. She is currently doing a detox. She is about to start her bootcamp class back at the gym 3 days a week. She will also be working out on her own 2 days a week. She has not come up with a specific diet plan but overall plans on cutting back calories.  Review of Systems      Past Medical History  Diagnosis Date  . Anxiety   . Hypertension     Current Outpatient Prescriptions  Medication Sig Dispense Refill  . ibuprofen (ADVIL,MOTRIN) 800 MG tablet Take 1 tablet (800 mg total) by mouth every 8 (eight) hours as needed. 30 tablet 1  . phentermine 37.5 MG capsule Take 1 capsule (37.5 mg total) by mouth every morning. 30 capsule 0   No current facility-administered medications for this visit.    No Known Allergies  Family History  Problem Relation Age of Onset  . Heart disease Maternal Aunt   . Heart disease Maternal Grandmother   . Hypertension Paternal Grandmother   . Heart disease Paternal Grandmother   . Diabetes Paternal Grandmother   . Hypertension Paternal Grandfather   . Cancer Paternal Grandfather     PROSTATE    History   Social History  . Marital Status: Single    Spouse Name: N/A    Number of Children: N/A  . Years of Education: N/A   Occupational History  . Not on file.   Social History Main Topics  . Smoking status: Never  Smoker   . Smokeless tobacco: Never Used  . Alcohol Use: No  . Drug Use: No  . Sexual Activity: Yes    Birth Control/ Protection: Pill     Comment: ORTHO TRI CYLCLEN   Other Topics Concern  . Not on file   Social History Narrative     Constitutional: Pt reports headache. Denies fever, malaise, fatigue, or abrupt weight changes.  HEENT: Denies eye pain, eye redness, ear pain, ringing in the ears, wax buildup, runny nose, nasal congestion, bloody nose, or sore throat. Respiratory: Denies difficulty breathing, shortness of breath, cough or sputum production.   Cardiovascular: Denies chest pain, chest tightness, palpitations or swelling in the hands or feet.  Neurological: Denies dizziness, difficulty with memory, difficulty with speech or problems with balance and coordination.   No other specific complaints in a complete review of systems (except as listed in HPI above).  Objective:   Physical Exam  BP 118/76 mmHg  Pulse 85  Temp(Src) 98.2 F (36.8 C) (Oral)  Wt 218 lb 8 oz (99.111 kg)  SpO2 99%  LMP 04/06/2013 Wt Readings from Last 3 Encounters:  04/07/14 218 lb 8 oz (99.111 kg)  09/25/13 220 lb 8 oz (100.018 kg)  08/26/13 227 lb 8 oz (103.193 kg)    General: Appears her stated age,  obese but well developed, well nourished in NAD. Skin: Warm, dry and intact. No rashes, lesions or ulcerations noted. HEENT: Head: normal shape and size; Eyes: sclera white, no icterus, conjunctiva pink, PERRLA and EOMs intact;  Neck: Normal range of motion. Neck supple, trachea midline. No massses, lumps or thyromegaly present.  Cardiovascular: Normal rate and rhythm. S1,S2 noted.  No murmur, rubs or gallops noted.  Pulmonary/Chest: Normal effort and positive vesicular breath sounds. No respiratory distress. No wheezes, rales or ronchi noted.  Neurological: Alert and oriented. Cranial nerves grosslyintact. Coordination normal.   BMET    Component Value Date/Time   NA 137 02/03/2013 1444     K 4.0 02/03/2013 1444   CL 105 02/03/2013 1444   CO2 26 02/03/2013 1444   GLUCOSE 127* 02/03/2013 1444   BUN 8 02/03/2013 1444   CREATININE 0.9 02/03/2013 1444   CREATININE 0.90 10/10/2011 1610   CALCIUM 9.2 02/03/2013 1444    Lipid Panel     Component Value Date/Time   CHOL 133 02/03/2013 1444   TRIG 48.0 02/03/2013 1444   HDL 42.20 02/03/2013 1444   CHOLHDL 3 02/03/2013 1444   VLDL 9.6 02/03/2013 1444   LDLCALC 81 02/03/2013 1444    CBC    Component Value Date/Time   WBC 8.7 02/03/2013 1444   RBC 5.10 02/03/2013 1444   HGB 12.3 02/03/2013 1444   HCT 37.9 02/03/2013 1444   PLT 266.0 02/03/2013 1444   MCV 74.2* 02/03/2013 1444   MCH 24.1* 10/10/2011 1610   MCHC 32.3 02/03/2013 1444   RDW 14.8* 02/03/2013 1444   LYMPHSABS 3.1 10/10/2011 1610   MONOABS 0.9 10/10/2011 1610   EOSABS 0.0 10/10/2011 1610   BASOSABS 0.0 10/10/2011 1610    Hgb A1C Lab Results  Component Value Date   HGBA1C 6.4 02/03/2013         Assessment & Plan:

## 2014-04-07 NOTE — Assessment & Plan Note (Signed)
Controlled with ibuprofen RX filled today

## 2014-04-07 NOTE — Progress Notes (Signed)
Pre visit review using our clinic review tool, if applicable. No additional management support is needed unless otherwise documented below in the visit note. 

## 2014-05-06 ENCOUNTER — Ambulatory Visit: Payer: Self-pay | Admitting: Internal Medicine

## 2014-05-10 ENCOUNTER — Encounter: Payer: Self-pay | Admitting: Internal Medicine

## 2014-05-10 ENCOUNTER — Ambulatory Visit (INDEPENDENT_AMBULATORY_CARE_PROVIDER_SITE_OTHER): Payer: 59 | Admitting: Internal Medicine

## 2014-05-10 VITALS — BP 118/72 | HR 66 | Temp 98.5°F | Wt 211.8 lb

## 2014-05-10 DIAGNOSIS — E669 Obesity, unspecified: Secondary | ICD-10-CM

## 2014-05-10 MED ORDER — PHENTERMINE HCL 37.5 MG PO CAPS: 37.5000 mg | ORAL_CAPSULE | ORAL | Status: DC

## 2014-05-10 NOTE — Assessment & Plan Note (Signed)
Congratulated her on her weight loss BP controlled Will refill Phentermine Advised her to keep up with diet and exercise  RTC in 1 month for followup BP/weight check

## 2014-05-10 NOTE — Progress Notes (Signed)
Subjective:    Patient ID: Brittany White, female    DOB: 12/30/1985, 29 y.o.   MRN: [id number]  HPI  Pt presents to the clinic today for 1 month follow up of weight/blood pressure. She was restarted on Phentermine 1 month ago. Her starting weight was 218. She has lost 7 lbs over the last month. She reports she is drinking 4 glasses of water per day and eating 6 small meals. She is also exercising in the gym 6 days per week, doing cardio and weight training. She denies dizziness, palpitations, chest pain or shortness of breath. BP today is 118/72.  Review of Systems      Past Medical History  Diagnosis Date  . Anxiety   . Hypertension     Current Outpatient Prescriptions  Medication Sig Dispense Refill  . ibuprofen (ADVIL,MOTRIN) 800 MG tablet Take 1 tablet (800 mg total) by mouth every 8 (eight) hours as needed. 30 tablet 1  . phentermine 37.5 MG capsule Take 1 capsule (37.5 mg total) by mouth every morning. 30 capsule 0   No current facility-administered medications for this visit.    No Known Allergies  Family History  Problem Relation Age of Onset  . Heart disease Maternal Aunt   . Heart disease Maternal Grandmother   . Hypertension Paternal Grandmother   . Heart disease Paternal Grandmother   . Diabetes Paternal Grandmother   . Hypertension Paternal Grandfather   . Cancer Paternal Grandfather     PROSTATE    History   Social History  . Marital Status: Single    Spouse Name: N/A    Number of Children: N/A  . Years of Education: N/A   Occupational History  . Not on file.   Social History Main Topics  . Smoking status: Never Smoker   . Smokeless tobacco: Never Used  . Alcohol Use: No  . Drug Use: No  . Sexual Activity: Yes    Birth Control/ Protection: Pill     Comment: ORTHO TRI CYLCLEN   Other Topics Concern  . Not on file   Social History Narrative     Constitutional: Denies fever, malaise, fatigue, headache or abrupt weight changes.    Respiratory: Denies difficulty breathing, shortness of breath, cough or sputum production.   Cardiovascular: Denies chest pain, chest tightness, palpitations or swelling in the hands or feet.  Gastrointestinal: Denies abdominal pain, bloating, constipation, diarrhea or blood in the stool.  Neurological: Denies dizziness, difficulty with memory, difficulty with speech or problems with balance and coordination.   No other specific complaints in a complete review of systems (except as listed in HPI above).  Objective:   Physical Exam   BP 118/72 mmHg  Pulse 66  Temp(Src) 98.5 F (36.9 C) (Oral)  Wt 211 lb 12 oz (96.049 kg)  SpO2 99%  LMP 05/09/2014 Wt Readings from Last 3 Encounters:  05/10/14 211 lb 12 oz (96.049 kg)  04/07/14 218 lb 8 oz (99.111 kg)  09/25/13 220 lb 8 oz (100.018 kg)    General: Appears her stated age, obese but well developed, well nourished in NAD. Skin: Warm, dry and intact. No rashes, lesions or ulcerations noted. Neck: Neck supple, trachea midline. No masses, lumps or thyromegaly present.  Cardiovascular: Normal rate and rhythm. S1,S2 noted.  No murmur, rubs or gallops noted. No JVD or BLE edema. No carotid bruits noted. Pulmonary/Chest: Normal effort and positive vesicular breath sounds. No respiratory distress. No wheezes, rales or ronchi noted.  Neurological: Alert  and oriented.  Psychiatric: Mood and affect normal. Behavior is normal. Judgment and thought content normal.     BMET    Component Value Date/Time   NA 137 02/03/2013 1444   K 4.0 02/03/2013 1444   CL 105 02/03/2013 1444   CO2 26 02/03/2013 1444   GLUCOSE 127* 02/03/2013 1444   BUN 8 02/03/2013 1444   CREATININE 0.9 02/03/2013 1444   CREATININE 0.90 10/10/2011 1610   CALCIUM 9.2 02/03/2013 1444    Lipid Panel     Component Value Date/Time   CHOL 133 02/03/2013 1444   TRIG 48.0 02/03/2013 1444   HDL 42.20 02/03/2013 1444   CHOLHDL 3 02/03/2013 1444   VLDL 9.6 02/03/2013 1444    LDLCALC 81 02/03/2013 1444    CBC    Component Value Date/Time   WBC 8.7 02/03/2013 1444   RBC 5.10 02/03/2013 1444   HGB 12.3 02/03/2013 1444   HCT 37.9 02/03/2013 1444   PLT 266.0 02/03/2013 1444   MCV 74.2* 02/03/2013 1444   MCH 24.1* 10/10/2011 1610   MCHC 32.3 02/03/2013 1444   RDW 14.8* 02/03/2013 1444   LYMPHSABS 3.1 10/10/2011 1610   MONOABS 0.9 10/10/2011 1610   EOSABS 0.0 10/10/2011 1610   BASOSABS 0.0 10/10/2011 1610    Hgb A1C Lab Results  Component Value Date   HGBA1C 6.4 02/03/2013        Assessment & Plan:

## 2014-05-10 NOTE — Progress Notes (Signed)
Pre visit review using our clinic review tool, if applicable. No additional management support is needed unless otherwise documented below in the visit note. 

## 2014-05-10 NOTE — Patient Instructions (Signed)

## 2014-05-20 ENCOUNTER — Encounter: Payer: Self-pay | Admitting: Internal Medicine

## 2014-06-08 ENCOUNTER — Ambulatory Visit (INDEPENDENT_AMBULATORY_CARE_PROVIDER_SITE_OTHER): Payer: 59 | Admitting: Internal Medicine

## 2014-06-08 ENCOUNTER — Encounter: Payer: Self-pay | Admitting: Internal Medicine

## 2014-06-08 VITALS — BP 130/84 | HR 80 | Temp 98.7°F | Wt 203.0 lb

## 2014-06-08 DIAGNOSIS — G44209 Tension-type headache, unspecified, not intractable: Secondary | ICD-10-CM

## 2014-06-08 DIAGNOSIS — E669 Obesity, unspecified: Secondary | ICD-10-CM

## 2014-06-08 MED ORDER — PHENTERMINE HCL 37.5 MG PO CAPS: 37.5000 mg | ORAL_CAPSULE | ORAL | Status: DC

## 2014-06-08 MED ORDER — IBUPROFEN 800 MG PO TABS: 800.0000 mg | ORAL_TABLET | Freq: Three times a day (TID) | ORAL | Status: DC | PRN

## 2014-06-08 NOTE — Assessment & Plan Note (Signed)
Well controlled on Ibuprofen BMET normal Refill provided today

## 2014-06-08 NOTE — Patient Instructions (Signed)
Exercise to Lose Weight Exercise and a healthy diet may help you lose weight. Your doctor may suggest specific exercises. EXERCISE IDEAS AND TIPS  Choose low-cost things you enjoy doing, such as walking, bicycling, or exercising to workout videos.  Take stairs instead of the elevator.  Walk during your lunch break.  Park your car further away from work or school.  Go to a gym or an exercise class.  Start with 5 to 10 minutes of exercise each day. Build up to 30 minutes of exercise 4 to 6 days a week.  Wear shoes with good support and comfortable clothes.  Stretch before and after working out.  Work out until you breathe harder and your heart beats faster.  Drink extra water when you exercise.  Do not do so much that you hurt yourself, feel dizzy, or get very short of breath. Exercises that burn about 150 calories:  Running 1  miles in 15 minutes.  Playing volleyball for 45 to 60 minutes.  Washing and waxing a car for 45 to 60 minutes.  Playing touch football for 45 minutes.  Walking 1  miles in 35 minutes.  Pushing a stroller 1  miles in 30 minutes.  Playing basketball for 30 minutes.  Raking leaves for 30 minutes.  Bicycling 5 miles in 30 minutes.  Walking 2 miles in 30 minutes.  Dancing for 30 minutes.  Shoveling snow for 15 minutes.  Swimming laps for 20 minutes.  Walking up stairs for 15 minutes.  Bicycling 4 miles in 15 minutes.  Gardening for 30 to 45 minutes.  Jumping rope for 15 minutes.  Washing windows or floors for 45 to 60 minutes. Document Released: 04/21/2010 Document Revised: 06/11/2011 Document Reviewed: 04/21/2010 Exit[employer] Patient Information 2015 [employer]. This information is not intended to replace advice given to you by your health care provider. Make sure you discuss any questions you have with your health care provider.  

## 2014-06-08 NOTE — Assessment & Plan Note (Signed)
Doing well on Phentermine Encouraged her to continue to work on diet and exercise Phentermine refilled today  RTC in 1 month or sooner if needed

## 2014-06-08 NOTE — Progress Notes (Signed)
Pre visit review using our clinic review tool, if applicable. No additional management support is needed unless otherwise documented below in the visit note. 

## 2014-06-08 NOTE — Progress Notes (Signed)
Subjective:    Patient ID: Brittany White, female    DOB: [birth year], 29 y.o.   MRN: [id number]  HPI  Pt presents to the clinic today for 1 month follow up of weight check and med refill. She is doing well on the phentermine. She has lost 8 lbs over the last month, 15 lbs total since 04/2014. She is exercising at the gym every day. She is meal prepping. Her lunch consist of a lean meat with at least 3 vegetables.  She would also like a refill of her Ibuprofen if she could get one today. She takes Ibuprofen prn for tension headaches. It works well for her. She denies GI upset.  Review of Systems      Past Medical History  Diagnosis Date  . Anxiety   . Hypertension     Current Outpatient Prescriptions  Medication Sig Dispense Refill  . ibuprofen (ADVIL,MOTRIN) 800 MG tablet Take 1 tablet (800 mg total) by mouth every 8 (eight) hours as needed. 30 tablet 1  . phentermine 37.5 MG capsule Take 1 capsule (37.5 mg total) by mouth every morning. 30 capsule 0   No current facility-administered medications for this visit.    No Known Allergies  Family History  Problem Relation Age of Onset  . Heart disease Maternal Aunt   . Heart disease Maternal Grandmother   . Hypertension Paternal Grandmother   . Heart disease Paternal Grandmother   . Diabetes Paternal Grandmother   . Hypertension Paternal Grandfather   . Cancer Paternal Grandfather     PROSTATE    History   Social History  . Marital Status: Single    Spouse Name: N/A  . Number of Children: N/A  . Years of Education: N/A   Occupational History  . Not on file.   Social History Main Topics  . Smoking status: Never Smoker   . Smokeless tobacco: Never Used  . Alcohol Use: No  . Drug Use: No  . Sexual Activity: Yes    Birth Control/ Protection: Pill     Comment: ORTHO TRI CYLCLEN   Other Topics Concern  . Not on file   Social History Narrative     Constitutional: Denies fever, malaise, fatigue, headache  or abrupt weight changes.  Respiratory: Denies difficulty breathing, shortness of breath, cough or sputum production.   Cardiovascular: Denies chest pain, chest tightness, palpitations or swelling in the hands or feet.  Gastrointestinal: Denies abdominal pain, bloating, constipation, diarrhea or blood in the stool.  Neurological: Denies dizziness, difficulty with memory, difficulty with speech or problems with balance and coordination.   No other specific complaints in a complete review of systems (except as listed in HPI above).  Objective:   Physical Exam   BP 130/84 mmHg  Pulse 80  Temp(Src) 98.7 F (37.1 C) (Oral)  Wt 203 lb (92.08 kg)  LMP 05/09/2014 Wt Readings from Last 3 Encounters:  06/08/14 203 lb (92.08 kg)  05/10/14 211 lb 12 oz (96.049 kg)  04/07/14 218 lb 8 oz (99.111 kg)    General: Appears her stated age, obese in NAD. HEENT: Head: normal shape and size; Eyes: sclera white, no icterus, conjunctiva pink, PERRLA and EOMs intact; Cardiovascular: Normal rate and rhythm. S1,S2 noted.  No murmur, rubs or gallops noted. Pulmonary/Chest: Normal effort and positive vesicular breath sounds. No respiratory distress. No wheezes, rales or ronchi noted.  Neurological: Alert and oriented.   BMET    Component Value Date/Time   NA 137 02/03/2013 1444  K 4.0 02/03/2013 1444   CL 105 02/03/2013 1444   CO2 26 02/03/2013 1444   GLUCOSE 127* 02/03/2013 1444   BUN 8 02/03/2013 1444   CREATININE 0.9 02/03/2013 1444   CREATININE 0.90 10/10/2011 1610   CALCIUM 9.2 02/03/2013 1444    Lipid Panel     Component Value Date/Time   CHOL 133 02/03/2013 1444   TRIG 48.0 02/03/2013 1444   HDL 42.20 02/03/2013 1444   CHOLHDL 3 02/03/2013 1444   VLDL 9.6 02/03/2013 1444   LDLCALC 81 02/03/2013 1444    CBC    Component Value Date/Time   WBC 8.7 02/03/2013 1444   RBC 5.10 02/03/2013 1444   HGB 12.3 02/03/2013 1444   HCT 37.9 02/03/2013 1444   PLT 266.0 02/03/2013 1444   MCV  74.2* 02/03/2013 1444   MCH 24.1* 10/10/2011 1610   MCHC 32.3 02/03/2013 1444   RDW 14.8* 02/03/2013 1444   LYMPHSABS 3.1 10/10/2011 1610   MONOABS 0.9 10/10/2011 1610   EOSABS 0.0 10/10/2011 1610   BASOSABS 0.0 10/10/2011 1610    Hgb A1C Lab Results  Component Value Date   HGBA1C 6.4 02/03/2013        Assessment & Plan:

## 2014-06-11 ENCOUNTER — Encounter: Payer: Self-pay | Admitting: Internal Medicine

## 2014-07-12 ENCOUNTER — Ambulatory Visit: Payer: Self-pay | Admitting: Internal Medicine

## 2014-07-26 ENCOUNTER — Ambulatory Visit: Payer: Self-pay | Admitting: Internal Medicine

## 2014-07-29 ENCOUNTER — Ambulatory Visit (INDEPENDENT_AMBULATORY_CARE_PROVIDER_SITE_OTHER): Payer: 59 | Admitting: Internal Medicine

## 2014-07-29 ENCOUNTER — Encounter: Payer: Self-pay | Admitting: Internal Medicine

## 2014-07-29 DIAGNOSIS — E669 Obesity, unspecified: Secondary | ICD-10-CM

## 2014-07-29 MED ORDER — PHENTERMINE HCL 37.5 MG PO CAPS: 37.5000 mg | ORAL_CAPSULE | ORAL | Status: DC

## 2014-07-29 NOTE — Patient Instructions (Signed)
Exercise to Lose Weight Exercise and a healthy diet may help you lose weight. Your doctor may suggest specific exercises. EXERCISE IDEAS AND TIPS  Choose low-cost things you enjoy doing, such as walking, bicycling, or exercising to workout videos.  Take stairs instead of the elevator.  Walk during your lunch break.  Park your car further away from work or school.  Go to a gym or an exercise class.  Start with 5 to 10 minutes of exercise each day. Build up to 30 minutes of exercise 4 to 6 days a week.  Wear shoes with good support and comfortable clothes.  Stretch before and after working out.  Work out until you breathe harder and your heart beats faster.  Drink extra water when you exercise.  Do not do so much that you hurt yourself, feel dizzy, or get very short of breath. Exercises that burn about 150 calories:  Running 1  miles in 15 minutes.  Playing volleyball for 45 to 60 minutes.  Washing and waxing a car for 45 to 60 minutes.  Playing touch football for 45 minutes.  Walking 1  miles in 35 minutes.  Pushing a stroller 1  miles in 30 minutes.  Playing basketball for 30 minutes.  Raking leaves for 30 minutes.  Bicycling 5 miles in 30 minutes.  Walking 2 miles in 30 minutes.  Dancing for 30 minutes.  Shoveling snow for 15 minutes.  Swimming laps for 20 minutes.  Walking up stairs for 15 minutes.  Bicycling 4 miles in 15 minutes.  Gardening for 30 to 45 minutes.  Jumping rope for 15 minutes.  Washing windows or floors for 45 to 60 minutes. Document Released: 04/21/2010 Document Revised: 06/11/2011 Document Reviewed: 04/21/2010 Exit[employer] Patient Information 2015 [employer]. This information is not intended to replace advice given to you by your health care provider. Make sure you discuss any questions you have with your health care provider.  

## 2014-07-29 NOTE — Assessment & Plan Note (Signed)
Doing well on phentermine Advised her to keep up the diet and exercise Phentermine refilled today   RTC in 1 month for weight check/med refill

## 2014-07-29 NOTE — Progress Notes (Signed)
Pre visit review using our clinic review tool, if applicable. No additional management support is needed unless otherwise documented below in the visit note. 

## 2014-07-29 NOTE — Progress Notes (Signed)
Subjective:    Patient ID: Brittany White, female    DOB: Jun 05, 1985, 29 y.o.   MRN: [id number]  HPI  Pt presents to the clinic today for 1 month follow up of weight check and med refill. She is doing well on the phentermine. She has lost 7 lbs over the last month, 22 lbs total since 04/2014. She is exercising at the gym every day with a trainer. She is meal prepping. Breakfast consist of 3 eggs whites and a meat. Her lunch consist of a lean meat with at least 3 vegetables. Dinner consists of a protein shake.   Review of Systems      Past Medical History  Diagnosis Date  . Anxiety   . Hypertension     Current Outpatient Prescriptions  Medication Sig Dispense Refill  . ibuprofen (ADVIL,MOTRIN) 800 MG tablet Take 1 tablet (800 mg total) by mouth every 8 (eight) hours as needed. 30 tablet 1  . phentermine 37.5 MG capsule Take 1 capsule (37.5 mg total) by mouth every morning. 30 capsule 0   No current facility-administered medications for this visit.    No Known Allergies  Family History  Problem Relation Age of Onset  . Heart disease Maternal Aunt   . Heart disease Maternal Grandmother   . Hypertension Paternal Grandmother   . Heart disease Paternal Grandmother   . Diabetes Paternal Grandmother   . Hypertension Paternal Grandfather   . Cancer Paternal Grandfather     PROSTATE    History   Social History  . Marital Status: Single    Spouse Name: N/A  . Number of Children: N/A  . Years of Education: N/A   Occupational History  . Not on file.   Social History Main Topics  . Smoking status: Never Smoker   . Smokeless tobacco: Never Used  . Alcohol Use: No  . Drug Use: No  . Sexual Activity: Yes    Birth Control/ Protection: Pill     Comment: ORTHO TRI CYLCLEN   Other Topics Concern  . Not on file   Social History Narrative     Constitutional: Denies fever, malaise, fatigue, headache or abrupt weight changes.  Respiratory: Denies difficulty  breathing, shortness of breath, cough or sputum production.   Cardiovascular: Denies chest pain, chest tightness, palpitations or swelling in the hands or feet.  Gastrointestinal: Denies abdominal pain, bloating, constipation, diarrhea or blood in the stool.  Neurological: Denies dizziness, difficulty with memory, difficulty with speech or problems with balance and coordination.   No other specific complaints in a complete review of systems (except as listed in HPI above).  Objective:   Physical Exam    Wt Readings from Last 3 Encounters:  06/08/14 203 lb (92.08 kg)  05/10/14 211 lb 12 oz (96.049 kg)  04/07/14 218 lb 8 oz (99.111 kg)    General: Appears her stated age, obese in NAD. HEENT: Head: normal shape and size; Eyes: sclera white, no icterus, conjunctiva pink, PERRLA and EOMs intact; Cardiovascular: Normal rate and rhythm. S1,S2 noted.  No murmur, rubs or gallops noted. Pulmonary/Chest: Normal effort and positive vesicular breath sounds. No respiratory distress. No wheezes, rales or ronchi noted.  Neurological: Alert and oriented.   BMET    Component Value Date/Time   NA 137 02/03/2013 1444   NA 138 06/04/2012 0847   K 4.0 02/03/2013 1444   K 3.7 06/04/2012 0847   CL 105 02/03/2013 1444   CL 109* 06/04/2012 0847   CO2 26  02/03/2013 1444   CO2 25 06/04/2012 0847   GLUCOSE 127* 02/03/2013 1444   GLUCOSE 101* 06/04/2012 0847   BUN 8 02/03/2013 1444   BUN 8 06/04/2012 0847   CREATININE 0.9 02/03/2013 1444   CREATININE 0.91 06/04/2012 0847   CREATININE 0.90 10/10/2011 1610   CALCIUM 9.2 02/03/2013 1444   CALCIUM 8.3* 06/04/2012 0847   GFRNONAA >60 06/04/2012 0847   GFRAA >60 06/04/2012 0847    Lipid Panel     Component Value Date/Time   CHOL 133 02/03/2013 1444   TRIG 48.0 02/03/2013 1444   HDL 42.20 02/03/2013 1444   CHOLHDL 3 02/03/2013 1444   VLDL 9.6 02/03/2013 1444   LDLCALC 81 02/03/2013 1444    CBC    Component Value Date/Time   WBC 8.7  02/03/2013 1444   WBC 6.9 06/04/2012 0847   RBC 5.10 02/03/2013 1444   RBC 5.34* 06/04/2012 0847   HGB 12.3 02/03/2013 1444   HGB 12.7 06/04/2012 0847   HCT 37.9 02/03/2013 1444   HCT 40.3 06/04/2012 0847   PLT 266.0 02/03/2013 1444   PLT 253 06/04/2012 0847   MCV 74.2* 02/03/2013 1444   MCV 76* 06/04/2012 0847   MCH 23.8* 06/04/2012 0847   MCH 24.1* 10/10/2011 1610   MCHC 32.3 02/03/2013 1444   MCHC 31.5* 06/04/2012 0847   RDW 14.8* 02/03/2013 1444   RDW 14.4 06/04/2012 0847   LYMPHSABS 3.1 10/10/2011 1610   MONOABS 0.9 10/10/2011 1610   EOSABS 0.0 10/10/2011 1610   BASOSABS 0.0 10/10/2011 1610    Hgb A1C Lab Results  Component Value Date   HGBA1C 6.4 02/03/2013        Assessment & Plan:

## 2014-08-26 ENCOUNTER — Ambulatory Visit: Payer: Self-pay | Admitting: Internal Medicine

## 2014-08-27 ENCOUNTER — Encounter: Payer: Self-pay | Admitting: Internal Medicine

## 2014-08-27 ENCOUNTER — Ambulatory Visit (INDEPENDENT_AMBULATORY_CARE_PROVIDER_SITE_OTHER): Payer: 59 | Admitting: Internal Medicine

## 2014-08-27 VITALS — BP 134/70 | HR 86 | Temp 98.9°F | Wt 190.0 lb

## 2014-08-27 DIAGNOSIS — H9202 Otalgia, left ear: Secondary | ICD-10-CM | POA: Diagnosis not present

## 2014-08-27 DIAGNOSIS — E669 Obesity, unspecified: Secondary | ICD-10-CM

## 2014-08-27 MED ORDER — PHENTERMINE HCL 37.5 MG PO CAPS: 37.5000 mg | ORAL_CAPSULE | ORAL | Status: DC

## 2014-08-27 NOTE — Progress Notes (Addendum)
Subjective:    Patient ID: Brittany White, female    DOB: 02-28-1986, 29 y.o.   MRN: [id number]  HPI  Pt presents to the clinic today for 1 month follow up of weight check and med refill. She is doing well on the phentermine. She has lost 8.5 lbs over the last month, 30.5 lbs total since 04/2014. She is exercising at the gym every day with a trainer. She is meal prepping. Breakfast consist of 3 eggs whites and a meat. Her lunch consist of a lean meat with at least 3 vegetables. Dinner consists of a protein shake.  She also c/o left ear pain. This has been going on for 1-2 months. She describes it more of a fullness/soreness. She denies loss of hearing. She denies fever, chills or other URI symptoms . She has not tried anything OTC.  Review of Systems      Past Medical History  Diagnosis Date  . Anxiety   . Hypertension     Current Outpatient Prescriptions  Medication Sig Dispense Refill  . ibuprofen (ADVIL,MOTRIN) 800 MG tablet Take 1 tablet (800 mg total) by mouth every 8 (eight) hours as needed. 30 tablet 1  . phentermine 37.5 MG capsule Take 1 capsule (37.5 mg total) by mouth every morning. 30 capsule 0   No current facility-administered medications for this visit.    No Known Allergies  Family History  Problem Relation Age of Onset  . Heart disease Maternal Aunt   . Heart disease Maternal Grandmother   . Hypertension Paternal Grandmother   . Heart disease Paternal Grandmother   . Diabetes Paternal Grandmother   . Hypertension Paternal Grandfather   . Cancer Paternal Grandfather     PROSTATE    History   Social History  . Marital Status: Single    Spouse Name: N/A  . Number of Children: N/A  . Years of Education: N/A   Occupational History  . Not on file.   Social History Main Topics  . Smoking status: Never Smoker   . Smokeless tobacco: Never Used  . Alcohol Use: No  . Drug Use: No  . Sexual Activity: Yes    Birth Control/ Protection: Pill   Comment: ORTHO TRI CYLCLEN   Other Topics Concern  . Not on file   Social History Narrative     Constitutional: Denies fever, malaise, fatigue, headache or abrupt weight changes.  HEENT: Pt rerorts left ear pain. Denies runny nose, nasal congestion or sore throat. Respiratory: Denies difficulty breathing, shortness of breath, cough or sputum production.   Cardiovascular: Denies chest pain, chest tightness, palpitations or swelling in the hands or feet.  Gastrointestinal: Denies abdominal pain, bloating, constipation, diarrhea or blood in the stool.  Neurological: Denies dizziness, difficulty with memory, difficulty with speech or problems with balance and coordination.   No other specific complaints in a complete review of systems (except as listed in HPI above).  Objective:   Physical Exam    Wt Readings from Last 3 Encounters:  07/29/14 194 lb 8 oz (88.225 kg)  06/08/14 203 lb (92.08 kg)  05/10/14 211 lb 12 oz (96.049 kg)    General: Appears her stated age, obese in NAD. HEENT: Head: normal shape and size; Eyes: sclera white, no icterus, conjunctiva pink; Ears: TM grey and intact, normal light reflex. She does have some pain with palpation over the left tragus and pulling on the left pinna. Canal not inflamed. No erythema or drainage noted. Cardiovascular: Normal rate and  rhythm. S1,S2 noted.  No murmur, rubs or gallops noted. Pulmonary/Chest: Normal effort and positive vesicular breath sounds. No respiratory distress. No wheezes, rales or ronchi noted.  Neurological: Alert and oriented.   BMET    Component Value Date/Time   NA 137 02/03/2013 1444   NA 138 06/04/2012 0847   K 4.0 02/03/2013 1444   K 3.7 06/04/2012 0847   CL 105 02/03/2013 1444   CL 109* 06/04/2012 0847   CO2 26 02/03/2013 1444   CO2 25 06/04/2012 0847   GLUCOSE 127* 02/03/2013 1444   GLUCOSE 101* 06/04/2012 0847   BUN 8 02/03/2013 1444   BUN 8 06/04/2012 0847   CREATININE 0.9 02/03/2013 1444    CREATININE 0.91 06/04/2012 0847   CREATININE 0.90 10/10/2011 1610   CALCIUM 9.2 02/03/2013 1444   CALCIUM 8.3* 06/04/2012 0847   GFRNONAA >60 06/04/2012 0847   GFRAA >60 06/04/2012 0847    Lipid Panel     Component Value Date/Time   CHOL 133 02/03/2013 1444   TRIG 48.0 02/03/2013 1444   HDL 42.20 02/03/2013 1444   CHOLHDL 3 02/03/2013 1444   VLDL 9.6 02/03/2013 1444   LDLCALC 81 02/03/2013 1444    CBC    Component Value Date/Time   WBC 8.7 02/03/2013 1444   WBC 6.9 06/04/2012 0847   RBC 5.10 02/03/2013 1444   RBC 5.34* 06/04/2012 0847   HGB 12.3 02/03/2013 1444   HGB 12.7 06/04/2012 0847   HCT 37.9 02/03/2013 1444   HCT 40.3 06/04/2012 0847   PLT 266.0 02/03/2013 1444   PLT 253 06/04/2012 0847   MCV 74.2* 02/03/2013 1444   MCV 76* 06/04/2012 0847   MCH 23.8* 06/04/2012 0847   MCH 24.1* 10/10/2011 1610   MCHC 32.3 02/03/2013 1444   MCHC 31.5* 06/04/2012 0847   RDW 14.8* 02/03/2013 1444   RDW 14.4 06/04/2012 0847   LYMPHSABS 3.1 10/10/2011 1610   MONOABS 0.9 10/10/2011 1610   EOSABS 0.0 10/10/2011 1610   BASOSABS 0.0 10/10/2011 1610    Hgb A1C Lab Results  Component Value Date   HGBA1C 6.4 02/03/2013        Assessment & Plan:   Otalgia, left ear:  No s/s of otitis externa Advise her to try Advil OTC Laying on a heating pad may also be helpful

## 2014-08-27 NOTE — Progress Notes (Signed)
Pre visit review using our clinic review tool, if applicable. No additional management support is needed unless otherwise documented below in the visit note. 

## 2014-08-27 NOTE — Assessment & Plan Note (Signed)
Doing well on phentermine Medication refilled today  RTC in 1 month for weight check/med refill

## 2014-08-27 NOTE — Patient Instructions (Signed)
Otalgia  The most common reason for this in children is an infection of the middle ear. Pain from the middle ear is usually caused by a build-up of fluid and pressure behind the eardrum. Pain from an earache can be sharp, dull, or burning. The pain may be temporary or constant. The middle ear is connected to the nasal passages by a short narrow tube called the Eustachian tube. The Eustachian tube allows fluid to drain out of the middle ear, and helps keep the pressure in your ear equalized.  CAUSES   A cold or allergy can block the Eustachian tube with inflammation and the build-up of secretions. This is especially likely in small children, because their Eustachian tube is shorter and more horizontal. When the Eustachian tube closes, the normal flow of fluid from the middle ear is stopped. Fluid can accumulate and cause stuffiness, pain, hearing loss, and an ear infection if germs start growing in this area.  SYMPTOMS   The symptoms of an ear infection may include fever, ear pain, fussiness, increased crying, and irritability. Many children will have temporary and minor hearing loss during and right after an ear infection. Permanent hearing loss is rare, but the risk increases the more infections a child has. Other causes of ear pain include retained water in the outer ear canal from swimming and bathing.  Ear pain in adults is less likely to be from an ear infection. Ear pain may be referred from other locations. Referred pain may be from the joint between your jaw and the skull. It may also come from a tooth problem or problems in the neck. Other causes of ear pain include:   A foreign body in the ear.   Outer ear infection.   Sinus infections.   Impacted ear wax.   Ear injury.   Arthritis of the jaw or TMJ problems.   Middle ear infection.   Tooth infections.   Sore throat with pain to the ears.  DIAGNOSIS   Your caregiver can usually make the diagnosis by examining you. Sometimes other special studies,  including x-rays and lab work may be necessary.  TREATMENT    If antibiotics were prescribed, use them as directed and finish them even if you or your child's symptoms seem to be improved.   Sometimes PE tubes are needed in children. These are little plastic tubes which are put into the eardrum during a simple surgical procedure. They allow fluid to drain easier and allow the pressure in the middle ear to equalize. This helps relieve the ear pain caused by pressure changes.  HOME CARE INSTRUCTIONS    Only take over-the-counter or prescription medicines for pain, discomfort, or fever as directed by your caregiver. DO NOT GIVE CHILDREN ASPIRIN because of the association of Reye's Syndrome in children taking aspirin.   Use a cold pack applied to the outer ear for 15-20 minutes, 03-04 times per day or as needed may reduce pain. Do not apply ice directly to the skin. You may cause frost bite.   Over-the-counter ear drops used as directed may be effective. Your caregiver may sometimes prescribe ear drops.   Resting in an upright position may help reduce pressure in the middle ear and relieve pain.   Ear pain caused by rapidly descending from high altitudes can be relieved by swallowing or chewing gum. Allowing infants to suck on a bottle during airplane travel can help.   Do not smoke in the house or near children. If you are   unable to quit smoking, smoke outside.   Control allergies.  SEEK IMMEDIATE MEDICAL CARE IF:    You or your child are becoming sicker.   Pain or fever relief is not obtained with medicine.   You or your child's symptoms (pain, fever, or irritability) do not improve within 24 to 48 hours or as instructed.   Severe pain suddenly stops hurting. This may indicate a ruptured eardrum.   You or your children develop new problems such as severe headaches, stiff neck, difficulty swallowing, or swelling of the face or around the ear.  Document Released: 11/04/2003 Document Revised: 06/11/2011  Document Reviewed: 03/10/2008  Exit[employer] Patient Information 2015 [employer]. This information is not intended to replace advice given to you by your health care provider. Make sure you discuss any questions you have with your health care provider.

## 2014-09-06 ENCOUNTER — Ambulatory Visit (INDEPENDENT_AMBULATORY_CARE_PROVIDER_SITE_OTHER): Payer: 59 | Admitting: Internal Medicine

## 2014-09-06 ENCOUNTER — Encounter: Payer: Self-pay | Admitting: Internal Medicine

## 2014-09-06 VITALS — BP 140/82 | HR 74 | Temp 98.7°F | Wt 191.0 lb

## 2014-09-06 DIAGNOSIS — Z3201 Encounter for pregnancy test, result positive: Secondary | ICD-10-CM | POA: Diagnosis not present

## 2014-09-06 NOTE — Patient Instructions (Signed)
First Trimester of Pregnancy The first trimester of pregnancy is from week 1 until the end of week 12 (months 1 through 3). A week after a sperm fertilizes an egg, the egg will implant on the wall of the uterus. This embryo will begin to develop into a baby. Genes from you and your partner are forming the baby. The female genes determine whether the baby is a boy or a girl. At 6-8 weeks, the eyes and face are formed, and the heartbeat can be seen on ultrasound. At the end of 12 weeks, all the baby's organs are formed.  Now that you are pregnant, you will want to do everything you can to have a healthy baby. Two of the most important things are to get good prenatal care and to follow your health care provider's instructions. Prenatal care is all the medical care you receive before the baby's birth. This care will help prevent, find, and treat any problems during the pregnancy and childbirth. BODY CHANGES Your body goes through many changes during pregnancy. The changes vary from woman to woman.   You may gain or lose a couple of pounds at first.  You may feel sick to your stomach (nauseous) and throw up (vomit). If the vomiting is uncontrollable, call your health care provider.  You may tire easily.  You may develop headaches that can be relieved by medicines approved by your health care provider.  You may urinate more often. Painful urination may mean you have a bladder infection.  You may develop heartburn as a result of your pregnancy.  You may develop constipation because certain hormones are causing the muscles that push waste through your intestines to slow down.  You may develop hemorrhoids or swollen, bulging veins (varicose veins).  Your breasts may begin to grow larger and become tender. Your nipples may stick out more, and the tissue that surrounds them (areola) may become darker.  Your gums may bleed and may be sensitive to brushing and flossing.  Dark spots or blotches (chloasma,  mask of pregnancy) may develop on your face. This will likely fade after the baby is born.  Your menstrual periods will stop.  You may have a loss of appetite.  You may develop cravings for certain kinds of food.  You may have changes in your emotions from day to day, such as being excited to be pregnant or being concerned that something may go wrong with the pregnancy and baby.  You may have more vivid and strange dreams.  You may have changes in your hair. These can include thickening of your hair, rapid growth, and changes in texture. Some women also have hair loss during or after pregnancy, or hair that feels dry or thin. Your hair will most likely return to normal after your baby is born. WHAT TO EXPECT AT YOUR PRENATAL VISITS During a routine prenatal visit:  You will be weighed to make sure you and the baby are growing normally.  Your blood pressure will be taken.  Your abdomen will be measured to track your baby's growth.  The fetal heartbeat will be listened to starting around week 10 or 12 of your pregnancy.  Test results from any previous visits will be discussed. Your health care provider may ask you:  How you are feeling.  If you are feeling the baby move.  If you have had any abnormal symptoms, such as leaking fluid, bleeding, severe headaches, or abdominal cramping.  If you have any questions. Other tests   that may be performed during your first trimester include:  Blood tests to find your blood type and to check for the presence of any previous infections. They will also be used to check for low iron levels (anemia) and Rh antibodies. Later in the pregnancy, blood tests for diabetes will be done along with other tests if problems develop.  Urine tests to check for infections, diabetes, or protein in the urine.  An ultrasound to confirm the proper growth and development of the baby.  An amniocentesis to check for possible genetic problems.  Fetal screens for  spina bifida and Down syndrome.  You may need other tests to make sure you and the baby are doing well. HOME CARE INSTRUCTIONS  Medicines  Follow your health care provider's instructions regarding medicine use. Specific medicines may be either safe or unsafe to take during pregnancy.  Take your prenatal vitamins as directed.  If you develop constipation, try taking a stool softener if your health care provider approves. Diet  Eat regular, well-balanced meals. Choose a variety of foods, such as meat or vegetable-based protein, fish, milk and low-fat dairy products, vegetables, fruits, and whole grain breads and cereals. Your health care provider will help you determine the amount of weight gain that is right for you.  Avoid raw meat and uncooked cheese. These carry germs that can cause birth defects in the baby.  Eating four or five small meals rather than three large meals a day may help relieve nausea and vomiting. If you start to feel nauseous, eating a few soda crackers can be helpful. Drinking liquids between meals instead of during meals also seems to help nausea and vomiting.  If you develop constipation, eat more high-fiber foods, such as fresh vegetables or fruit and whole grains. Drink enough fluids to keep your urine clear or pale yellow. Activity and Exercise  Exercise only as directed by your health care provider. Exercising will help you:  Control your weight.  Stay in shape.  Be prepared for labor and delivery.  Experiencing pain or cramping in the lower abdomen or low back is a good sign that you should stop exercising. Check with your health care provider before continuing normal exercises.  Try to avoid standing for long periods of time. Move your legs often if you must stand in one place for a long time.  Avoid heavy lifting.  Wear low-heeled shoes, and practice good posture.  You may continue to have sex unless your health care provider directs you  otherwise. Relief of Pain or Discomfort  Wear a good support bra for breast tenderness.   Take warm sitz baths to soothe any pain or discomfort caused by hemorrhoids. Use hemorrhoid cream if your health care provider approves.   Rest with your legs elevated if you have leg cramps or low back pain.  If you develop varicose veins in your legs, wear support hose. Elevate your feet for 15 minutes, 3-4 times a day. Limit salt in your diet. Prenatal Care  Schedule your prenatal visits by the twelfth week of pregnancy. They are usually scheduled monthly at first, then more often in the last 2 months before delivery.  Write down your questions. Take them to your prenatal visits.  Keep all your prenatal visits as directed by your health care provider. Safety  Wear your seat belt at all times when driving.  Make a list of emergency phone numbers, including numbers for family, friends, the hospital, and police and fire departments. General Tips    Ask your health care provider for a referral to a local prenatal education class. Begin classes no later than at the beginning of month 6 of your pregnancy.  Ask for help if you have counseling or nutritional needs during pregnancy. Your health care provider can offer advice or refer you to specialists for help with various needs.  Do not use hot tubs, steam rooms, or saunas.  Do not douche or use tampons or scented sanitary pads.  Do not cross your legs for long periods of time.  Avoid cat litter boxes and soil used by cats. These carry germs that can cause birth defects in the baby and possibly loss of the fetus by miscarriage or stillbirth.  Avoid all smoking, herbs, alcohol, and medicines not prescribed by your health care provider. Chemicals in these affect the formation and growth of the baby.  Schedule a dentist appointment. At home, brush your teeth with a soft toothbrush and be gentle when you floss. SEEK MEDICAL CARE IF:   You have  dizziness.  You have mild pelvic cramps, pelvic pressure, or nagging pain in the abdominal area.  You have persistent nausea, vomiting, or diarrhea.  You have a bad smelling vaginal discharge.  You have pain with urination.  You notice increased swelling in your face, hands, legs, or ankles. SEEK IMMEDIATE MEDICAL CARE IF:   You have a fever.  You are leaking fluid from your vagina.  You have spotting or bleeding from your vagina.  You have severe abdominal cramping or pain.  You have rapid weight gain or loss.  You vomit blood or material that looks like coffee grounds.  You are exposed to German measles and have never had them.  You are exposed to fifth disease or chickenpox.  You develop a severe headache.  You have shortness of breath.  You have any kind of trauma, such as from a fall or a car accident. Document Released: 03/13/2001 Document Revised: 08/03/2013 Document Reviewed: 01/27/2013 [employer] Patient Information 2015 [employer]. This information is not intended to replace advice given to you by your health care provider. Make sure you discuss any questions you have with your health care provider.  

## 2014-09-06 NOTE — Progress Notes (Signed)
Subjective:    Patient ID: Brittany White, female    DOB: [birth year], 29 y.o.   MRN: [id number]  HPI  Pt presents to the clinic today with c/o amenorrhea. She thinks she may be pregnant. Her LMP was 07/30/14. She did take 2 home pregnancy test which were positive. She reports she is experiencing fatigue, mild nausea and breast tenderness. She is G1P0. She has stopped taking her Phentermine. She has already made an appt with [facility] for 09/13/14.  Review of Systems      Past Medical History  Diagnosis Date  . Anxiety   . Hypertension     Current Outpatient Prescriptions  Medication Sig Dispense Refill  . ibuprofen (ADVIL,MOTRIN) 800 MG tablet Take 1 tablet (800 mg total) by mouth every 8 (eight) hours as needed. (Patient not taking: Reported on 09/06/2014) 30 tablet 1   No current facility-administered medications for this visit.    No Known Allergies  Family History  Problem Relation Age of Onset  . Heart disease Maternal Aunt   . Heart disease Maternal Grandmother   . Hypertension Paternal Grandmother   . Heart disease Paternal Grandmother   . Diabetes Paternal Grandmother   . Hypertension Paternal Grandfather   . Cancer Paternal Grandfather     PROSTATE    History   Social History  . Marital Status: Single    Spouse Name: N/A  . Number of Children: N/A  . Years of Education: N/A   Occupational History  . Not on file.   Social History Main Topics  . Smoking status: Never Smoker   . Smokeless tobacco: Never Used  . Alcohol Use: No  . Drug Use: No  . Sexual Activity: Yes    Birth Control/ Protection: Pill     Comment: ORTHO TRI CYLCLEN   Other Topics Concern  . Not on file   Social History Narrative     Constitutional: Pt reports fatigue. Denies fever, malaise, headache or abrupt weight changes.  Respiratory: Denies difficulty breathing, shortness of breath, cough or sputum production.   Cardiovascular: Denies chest pain, chest tightness,  palpitations or swelling in the hands or feet.  Gastrointestinal: Pt reports nausea. Denies abdominal pain, bloating, constipation, diarrhea or blood in the stool.  GU: Pt reports amenorrhea. Denies urgency, frequency, pain with urination, burning sensation, blood in urine, odor or discharge.  No other specific complaints in a complete review of systems (except as listed in HPI above).  Objective:   Physical Exam   BP 140/82 mmHg  Pulse 74  Temp(Src) 98.7 F (37.1 C) (Oral)  Wt 191 lb (86.637 kg)  LMP 07/29/2014 Wt Readings from Last 3 Encounters:  09/06/14 191 lb (86.637 kg)  08/27/14 190 lb (86.183 kg)  07/29/14 194 lb 8 oz (88.225 kg)    General: Appears her stated age, well developed, well nourished in NAD. Cardiovascular: Normal rate and rhythm. S1,S2 noted.  No murmur, rubs or gallops noted. No JVD or BLE edema. No carotid bruits noted. Pulmonary/Chest: Normal effort and positive vesicular breath sounds. No respiratory distress. No wheezes, rales or ronchi noted.  Abdomen: Deffered.  BMET    Component Value Date/Time   NA 137 02/03/2013 1444   NA 138 06/04/2012 0847   K 4.0 02/03/2013 1444   K 3.7 06/04/2012 0847   CL 105 02/03/2013 1444   CL 109* 06/04/2012 0847   CO2 26 02/03/2013 1444   CO2 25 06/04/2012 0847   GLUCOSE 127* 02/03/2013 1444  GLUCOSE 101* 06/04/2012 0847   BUN 8 02/03/2013 1444   BUN 8 06/04/2012 0847   CREATININE 0.9 02/03/2013 1444   CREATININE 0.91 06/04/2012 0847   CREATININE 0.90 10/10/2011 1610   CALCIUM 9.2 02/03/2013 1444   CALCIUM 8.3* 06/04/2012 0847   GFRNONAA >60 06/04/2012 0847   GFRAA >60 06/04/2012 0847    Lipid Panel     Component Value Date/Time   CHOL 133 02/03/2013 1444   TRIG 48.0 02/03/2013 1444   HDL 42.20 02/03/2013 1444   CHOLHDL 3 02/03/2013 1444   VLDL 9.6 02/03/2013 1444   LDLCALC 81 02/03/2013 1444    CBC    Component Value Date/Time   WBC 8.7 02/03/2013 1444   WBC 6.9 06/04/2012 0847   RBC 5.10  02/03/2013 1444   RBC 5.34* 06/04/2012 0847   HGB 12.3 02/03/2013 1444   HGB 12.7 06/04/2012 0847   HCT 37.9 02/03/2013 1444   HCT 40.3 06/04/2012 0847   PLT 266.0 02/03/2013 1444   PLT 253 06/04/2012 0847   MCV 74.2* 02/03/2013 1444   MCV 76* 06/04/2012 0847   MCH 23.8* 06/04/2012 0847   MCH 24.1* 10/10/2011 1610   MCHC 32.3 02/03/2013 1444   MCHC 31.5* 06/04/2012 0847   RDW 14.8* 02/03/2013 1444   RDW 14.4 06/04/2012 0847   LYMPHSABS 3.1 10/10/2011 1610   MONOABS 0.9 10/10/2011 1610   EOSABS 0.0 10/10/2011 1610   BASOSABS 0.0 10/10/2011 1610    Hgb A1C Lab Results  Component Value Date   HGBA1C 6.4 02/03/2013        Assessment & Plan:   Positive pregnancy test:  Urine hcg positive Discussed starting prenatal vitamins Advised her to avoid Ibuprofen. Take Tylenol as needed for pain Avoid other OTC drugs Discussed foods to avoid including lunch meat, sushi, fish, etc She already has an appt with OB/GYN  RTC as needed

## 2014-09-06 NOTE — Progress Notes (Signed)
Pre visit review using our clinic review tool, if applicable. No additional management support is needed unless otherwise documented below in the visit note. 

## 2014-09-07 ENCOUNTER — Encounter: Payer: Self-pay | Admitting: Internal Medicine

## 2014-09-08 ENCOUNTER — Encounter: Payer: Self-pay | Admitting: Internal Medicine

## 2014-09-13 ENCOUNTER — Ambulatory Visit (INDEPENDENT_AMBULATORY_CARE_PROVIDER_SITE_OTHER): Payer: 59

## 2014-09-13 VITALS — BP 118/62 | HR 81 | Wt 194.0 lb

## 2014-09-13 DIAGNOSIS — Z36 Encounter for antenatal screening of mother: Secondary | ICD-10-CM

## 2014-09-13 DIAGNOSIS — Z369 Encounter for antenatal screening, unspecified: Secondary | ICD-10-CM

## 2014-09-13 NOTE — Progress Notes (Signed)
Brittany White for NOB nurse interview visit. G1. P0. Pregnancy eduction material explained and given. NOB labs ordered. HIV consent form signed. PNV encouraged. NT declined Pt. To follow up with provider in 4 weeks for NOB physical. To follow up in 1 week for dating and viability scan. TSH and A1c ordered due to increased BMI. LMP 07/29/2014, EDD 05/05/2015. Pt c/o headache and nose bleeds. Advised pt on plain Tylenol 325mg  for headache, as well as saline and Vaseline for nosebleeds. All questions answered.

## 2014-09-13 NOTE — Patient Instructions (Signed)
Pt to follow up for viability and dating scan as soon as possible, and 4 weeks for appt with Dr.Cherry.

## 2014-09-15 ENCOUNTER — Other Ambulatory Visit: Payer: Self-pay | Admitting: Obstetrics and Gynecology

## 2014-09-15 DIAGNOSIS — N911 Secondary amenorrhea: Secondary | ICD-10-CM

## 2014-09-17 ENCOUNTER — Other Ambulatory Visit: Payer: 59

## 2014-09-17 ENCOUNTER — Ambulatory Visit: Payer: 59

## 2014-09-17 DIAGNOSIS — Z369 Encounter for antenatal screening, unspecified: Secondary | ICD-10-CM

## 2014-09-17 DIAGNOSIS — N911 Secondary amenorrhea: Secondary | ICD-10-CM | POA: Diagnosis not present

## 2014-09-18 LAB — HEP, RPR, HIV PANEL
HIV SCREEN 4TH GENERATION: NONREACTIVE
Hepatitis B Surface Ag: NEGATIVE
RPR Ser Ql: NONREACTIVE

## 2014-09-18 LAB — THYROID PANEL WITH TSH
Free Thyroxine Index: 2 (ref 1.2–4.9)
T3 Uptake Ratio: 29 % (ref 24–39)
T4, Total: 6.9 ug/dL (ref 4.5–12.0)
TSH: 1.01 u[IU]/mL (ref 0.450–4.500)

## 2014-09-18 LAB — HEMOGLOBIN A1C
ESTIMATED AVERAGE GLUCOSE: 114 mg/dL
HEMOGLOBIN A1C: 5.6 % (ref 4.8–5.6)

## 2014-09-18 LAB — CBC WITH DIFFERENTIAL/PLATELET
Basophils Absolute: 0 10*3/uL (ref 0.0–0.2)
Basos: 0 %
EOS (ABSOLUTE): 0.1 10*3/uL (ref 0.0–0.4)
Eos: 0 %
HEMATOCRIT: 37.6 % (ref 34.0–46.6)
HEMOGLOBIN: 11.9 g/dL (ref 11.1–15.9)
Immature Grans (Abs): 0 10*3/uL (ref 0.0–0.1)
Immature Granulocytes: 0 %
Lymphocytes Absolute: 2.5 10*3/uL (ref 0.7–3.1)
Lymphs: 20 %
MCH: 23.8 pg — AB (ref 26.6–33.0)
MCHC: 31.6 g/dL (ref 31.5–35.7)
MCV: 75 fL — ABNORMAL LOW (ref 79–97)
MONOS ABS: 1.1 10*3/uL — AB (ref 0.1–0.9)
Monocytes: 9 %
Neutrophils Absolute: 8.5 10*3/uL — ABNORMAL HIGH (ref 1.4–7.0)
Neutrophils: 71 %
Platelets: 303 10*3/uL (ref 150–379)
RBC: 4.99 x10E6/uL (ref 3.77–5.28)
RDW: 16.4 % — ABNORMAL HIGH (ref 12.3–15.4)
WBC: 12.1 10*3/uL — AB (ref 3.4–10.8)

## 2014-09-18 LAB — RUBELLA SCREEN: Rubella Antibodies, IGG: 1.83 index (ref >0.99)

## 2014-09-18 LAB — ANTIBODY SCREEN: Antibody Screen: NEGATIVE

## 2014-09-18 LAB — ABO AND RH: Rh Factor: POSITIVE

## 2014-09-18 LAB — VARICELLA ZOSTER ANTIBODY, IGG: Varicella zoster IgG: 921 index (ref >165)

## 2014-09-27 ENCOUNTER — Ambulatory Visit: Payer: Self-pay | Admitting: Internal Medicine

## 2014-09-29 ENCOUNTER — Encounter: Payer: Self-pay | Admitting: Obstetrics and Gynecology

## 2014-09-29 ENCOUNTER — Telehealth: Payer: Self-pay | Admitting: Obstetrics and Gynecology

## 2014-09-29 NOTE — Telephone Encounter (Signed)
Patient called requesting an appointment to have the labs drawn for genetic testing so they can also find out the gender of the baby. She would like this done ASAP. Please Advise.Thanks

## 2014-10-01 NOTE — Telephone Encounter (Addendum)
Advised pt on this issue via MyChart. See documentation there.

## 2014-10-06 ENCOUNTER — Encounter: Payer: Self-pay | Admitting: Obstetrics and Gynecology

## 2014-10-07 ENCOUNTER — Encounter: Payer: Self-pay | Admitting: Obstetrics and Gynecology

## 2014-10-13 ENCOUNTER — Encounter: Payer: Self-pay | Admitting: Obstetrics and Gynecology

## 2014-10-14 ENCOUNTER — Encounter: Payer: Self-pay | Admitting: Obstetrics and Gynecology

## 2014-10-14 ENCOUNTER — Ambulatory Visit (INDEPENDENT_AMBULATORY_CARE_PROVIDER_SITE_OTHER): Payer: 59 | Admitting: Obstetrics and Gynecology

## 2014-10-14 VITALS — BP 112/66 | HR 69 | Wt 197.0 lb

## 2014-10-14 DIAGNOSIS — O10911 Unspecified pre-existing hypertension complicating pregnancy, first trimester: Secondary | ICD-10-CM

## 2014-10-14 DIAGNOSIS — O10919 Unspecified pre-existing hypertension complicating pregnancy, unspecified trimester: Secondary | ICD-10-CM

## 2014-10-14 DIAGNOSIS — E669 Obesity, unspecified: Secondary | ICD-10-CM

## 2014-10-14 DIAGNOSIS — Z3401 Encounter for supervision of normal first pregnancy, first trimester: Secondary | ICD-10-CM

## 2014-10-14 DIAGNOSIS — I10 Essential (primary) hypertension: Secondary | ICD-10-CM

## 2014-10-14 DIAGNOSIS — Z113 Encounter for screening for infections with a predominantly sexual mode of transmission: Secondary | ICD-10-CM

## 2014-10-14 HISTORY — DX: Unspecified pre-existing hypertension complicating pregnancy, unspecified trimester: O10.919

## 2014-10-14 LAB — POCT URINALYSIS DIPSTICK
Bilirubin, UA: NEGATIVE
Blood, UA: NEGATIVE
GLUCOSE UA: NEGATIVE
KETONES UA: NEGATIVE
Leukocytes, UA: NEGATIVE
NITRITE UA: NEGATIVE
Protein, UA: NEGATIVE
SPEC GRAV UA: 1.02
Urobilinogen, UA: NEGATIVE
pH, UA: 6.5

## 2014-10-15 ENCOUNTER — Encounter: Payer: Self-pay | Admitting: Obstetrics and Gynecology

## 2014-10-15 NOTE — Progress Notes (Signed)
Subjective:    Brittany White is being seen today for her first obstetrical visit.  This is a planned pregnancy. She is at [redacted]w[redacted]d gestation. Her obstetrical history is significant for obesity (Class 1), chronic HTN (not requiring meds). Relationship with FOB: significant other, living together. Patient does intend to breast feed. Pregnancy history fully reviewed.  Menstrual History: OB History    Gravida Para Term Preterm AB TAB SAB Ectopic Multiple Living   1               Menarche age: 73 Patient's last menstrual period was 07/29/2014.  Denies h/o STIs.  H/o abnormal pap smear in 2010 (no f/u until 2013 with normal pap).       Past Medical History  Diagnosis Date  . Anxiety   . Chronic hypertension complicating or reason for care during pregnancy 10/14/2014    controlled by lifestyle modification, on no meds   Patient Active Problem List   Diagnosis Date Noted  . Obesity (BMI 30.0-34.9) 09/25/2013  . Tension headache 02/03/2013    History reviewed. No pertinent past surgical history.   Family History  Problem Relation Age of Onset  . Heart disease Maternal Aunt   . Heart disease Maternal Grandmother   . Hypertension Paternal Grandmother   . Heart disease Paternal Grandmother   . Diabetes Paternal Grandmother   . Hypertension Paternal Grandfather   . Cancer Paternal Grandfather     PROSTATE   History   Social History  . Marital Status: Single    Spouse Name: N/A  . Number of Children: N/A  . Years of Education: N/A   Occupational History  . Not on file.   Social History Main Topics  . Smoking status: Never Smoker   . Smokeless tobacco: Never Used  . Alcohol Use: No  . Drug Use: No  . Sexual Activity: Yes    Birth Control/ Protection: None     Comment: Pregnant   Other Topics Concern  . Not on file   Social History Narrative   Outpatient Encounter Prescriptions as of 10/14/2014  Medication Sig  . Prenatal Vit-Fe Fumarate-FA (MULTIVITAMIN-PRENATAL)  27-0.8 MG TABS tablet Take 1 tablet by mouth daily at 12 noon.   No facility-administered encounter medications on file as of 10/14/2014.    No Known Allergies   Review of Systems General:Not Present- Fever, Weight Loss and Weight Gain. Skin:Not Present- Rash. HEENT:Not Present- Blurred Vision, Headache and Bleeding Gums. Respiratory:Not Present- Difficulty Breathing. Breast:Not Present- Breast Mass. Cardiovascular:Not Present- Chest Pain, Elevated Blood Pressure, Fainting / Blacking Out and Shortness of Breath. Gastrointestinal:Not Present- Abdominal Pain, Constipation, Nausea and Vomiting. Female Genitourinary:Not Present- Frequency, Painful Urination, Pelvic Pain, Vaginal Bleeding, Vaginal Discharge, Contractions, regular, Fetal Movements Decreased, Urinary Complaints and Vaginal Fluid. Musculoskeletal: Present- Back Pain and neck pain.  Not present - Leg Cramps. Neurological:Not Present- Dizziness. Psychiatric:Not Present- Depression.    Objective:   Blood pressure 112/66, pulse 69, weight 197 lb (89.359 kg), last menstrual period 07/29/2014.  General Appearance:    Alert, cooperative, no distress, appears stated age  Head:    Normocephalic, without obvious abnormality, atraumatic  Eyes:    PERRL, conjunctiva/corneas clear, EOM's intact, both eyes  Ears:    Normal external ear canals, both ears  Nose:   Nares normal, septum midline, mucosa normal, no drainage or sinus tenderness  Throat:   Lips, mucosa, and tongue normal; teeth and gums normal  Neck:   Supple, symmetrical, trachea midline, no adenopathy; thyroid: no  enlargement/tenderness/nodules; no       carotid bruit or JVD  Back:     Symmetric, no curvature, ROM normal, no CVA tenderness  Lungs:     Clear to auscultation bilaterally, respirations unlabored  Chest Wall:    No tenderness or deformity   Heart:    Regular rate and rhythm, S1 and S2 normal, no murmur, rub or gallop  Breast Exam:    No tenderness,  masses, or nipple abnormality  Abdomen:     Soft, non-tender, bowel sounds active all four quadrants, no masses, no organomegaly.  FH 11.  FHT 163   bpm  Genitalia:    Pelvic:external genitalia normal, vagina with lesions, discharge, or tenderness, rectovaginal septum       normal. Cervix normal in appearance, no cervical motion tenderness, no adnexal masses or tenderness.     Pregnancy positive findings: uterine enlargement: 11-12 wk size, nontender.   Rectal:    Normal external sphincter.  No hemorrhoids appreciated. Internal exam not done.   Extremities:   Extremities normal, atraumatic, no cyanosis or edema  Pulses:   2+ and symmetric all extremities  Skin:   Skin color, texture, turgor normal, no rashes or lesions  Lymph nodes:   Cervical, supraclavicular, and axillary nodes normal  Neurologic:   CNII-XII intact, normal strength, sensation and reflexes throughout     Assessment:    Pregnancy at 11 and 1/7 weeks   H/o chronic HTN (on no meds, controlled with lifestyle management)  Obesity Class I  Mild musculoskeletal pain  Plan:    Initial labs reviewed. Prenatal vitamins. Problem list reviewed and updated. AFP3 discussed: declined.  Panorama testing previously performed, negative screening.  Role of ultrasound in pregnancy discussed; fetal survey: to order at appropriate gestational age.   Patient to begin daily aspirin for h/o cHTN . Will need baseline PIH labs.  To discuss early glucola next visit as patient with risk factors (mild obesity, h/o HTN).  Advised on Tylenol prn for pain.  Follow up in 4 weeks. New OB counseling: The patient has been given an overview regarding routine prenatal care. Recommendations regarding diet, weight gain, and exercise in pregnancy were given.  Prenatal testing, optional genetic testing, and ultrasound use in pregnancy were reviewed. Benefits of Breast Feeding were discussed. The patient is encouraged to consider nursing her baby post  partum.   Hildred LaserAnika Manami Tutor, MD [facility]

## 2014-10-19 LAB — NUSWAB VAGINITIS PLUS (VG+): CANDIDA GLABRATA, NAA: NEGATIVE

## 2014-10-24 ENCOUNTER — Encounter: Payer: Self-pay | Admitting: Obstetrics and Gynecology

## 2014-11-11 ENCOUNTER — Ambulatory Visit (INDEPENDENT_AMBULATORY_CARE_PROVIDER_SITE_OTHER): Payer: 59 | Admitting: Obstetrics and Gynecology

## 2014-11-11 ENCOUNTER — Encounter: Payer: Self-pay | Admitting: Obstetrics and Gynecology

## 2014-11-11 VITALS — BP 114/65 | HR 73 | Wt 201.1 lb

## 2014-11-11 DIAGNOSIS — E669 Obesity, unspecified: Secondary | ICD-10-CM

## 2014-11-11 DIAGNOSIS — I10 Essential (primary) hypertension: Secondary | ICD-10-CM

## 2014-11-11 DIAGNOSIS — Z3492 Encounter for supervision of normal pregnancy, unspecified, second trimester: Secondary | ICD-10-CM

## 2014-11-11 DIAGNOSIS — E66811 Obesity, class 1: Secondary | ICD-10-CM

## 2014-11-11 LAB — POCT URINALYSIS DIPSTICK
Bilirubin, UA: NEGATIVE
GLUCOSE UA: NEGATIVE
Ketones, UA: NEGATIVE
Leukocytes, UA: NEGATIVE
NITRITE UA: NEGATIVE
PH UA: 6.5
Protein, UA: NEGATIVE
RBC UA: NEGATIVE
Spec Grav, UA: 1.015
UROBILINOGEN UA: 0.2

## 2014-11-11 MED ORDER — RANITIDINE HCL 150 MG PO CAPS: 150.0000 mg | ORAL_CAPSULE | Freq: Two times a day (BID) | ORAL | Status: DC

## 2014-11-11 NOTE — Progress Notes (Signed)
ROB: Patient doing well.  Denies complaints. Has not yet initiated daily baby aspirin, encouraged to do so. To get baseline PIH labs today for h/o cHTN (on no meds). Discussed need for early glucola.  Patient will perform next week. Panorama testing with low risk. Unsure of serum AFP testing.  To reassess again at next visit.  RTC in 4 weeks for routine OB visit and anatomy scan at that time.

## 2014-11-11 NOTE — Progress Notes (Signed)
PIh labs today. Unsure if wants 2nd trimester screen. To discuss with AC.

## 2014-11-11 NOTE — Patient Instructions (Signed)
You will need to return for lab work next week (screening for gestational diabetes). Please be fasting for exam.

## 2014-11-12 LAB — CBC WITH DIFFERENTIAL/PLATELET
BASOS: 0 %
Basophils Absolute: 0 10*3/uL (ref 0.0–0.2)
EOS (ABSOLUTE): 0 10*3/uL (ref 0.0–0.4)
EOS: 0 %
HEMATOCRIT: 35.3 % (ref 34.0–46.6)
HEMOGLOBIN: 11.2 g/dL (ref 11.1–15.9)
IMMATURE GRANS (ABS): 0 10*3/uL (ref 0.0–0.1)
Immature Granulocytes: 0 %
LYMPHS: 21 %
Lymphocytes Absolute: 2.6 10*3/uL (ref 0.7–3.1)
MCH: 23.7 pg — AB (ref 26.6–33.0)
MCHC: 31.7 g/dL (ref 31.5–35.7)
MCV: 75 fL — ABNORMAL LOW (ref 79–97)
Monocytes Absolute: 1 10*3/uL — ABNORMAL HIGH (ref 0.1–0.9)
Monocytes: 8 %
NEUTROS PCT: 71 %
Neutrophils Absolute: 8.5 10*3/uL — ABNORMAL HIGH (ref 1.4–7.0)
Platelets: 279 10*3/uL (ref 150–379)
RBC: 4.72 x10E6/uL (ref 3.77–5.28)
RDW: 15.9 % — ABNORMAL HIGH (ref 12.3–15.4)
WBC: 12.2 10*3/uL — AB (ref 3.4–10.8)

## 2014-11-12 LAB — RENAL FUNCTION PANEL
Albumin: 3.7 g/dL (ref 3.5–5.5)
BUN / CREAT RATIO: 8 (ref 8–20)
BUN: 6 mg/dL (ref 6–20)
CALCIUM: 8.8 mg/dL (ref 8.7–10.2)
CHLORIDE: 104 mmol/L (ref 97–108)
CO2: 20 mmol/L (ref 18–29)
Creatinine, Ser: 0.78 mg/dL (ref 0.57–1.00)
GFR calc Af Amer: 121 mL/min/{1.73_m2} (ref >59)
GFR calc non Af Amer: 105 mL/min/{1.73_m2} (ref >59)
GLUCOSE: 122 mg/dL — AB (ref 65–99)
Phosphorus: 3.4 mg/dL (ref 2.5–4.5)
Potassium: 4.1 mmol/L (ref 3.5–5.2)
Sodium: 140 mmol/L (ref 134–144)

## 2014-11-12 LAB — MICROALBUMIN / CREATININE URINE RATIO
Creatinine, Urine: 132.4 mg/dL
MICROALB/CREAT RATIO: 2.7 mg/g{creat} (ref 0.0–30.0)
Microalbumin, Urine: 3.6 ug/mL

## 2014-11-12 LAB — URIC ACID: URIC ACID: 3.1 mg/dL (ref 2.5–7.1)

## 2014-11-12 LAB — HEPATIC FUNCTION PANEL
ALT: 18 IU/L (ref 0–32)
AST: 14 IU/L (ref 0–40)
Alkaline Phosphatase: 73 IU/L (ref 39–117)
BILIRUBIN, DIRECT: 0.05 mg/dL (ref 0.00–0.40)
Total Protein: 6.1 g/dL (ref 6.0–8.5)

## 2014-11-17 ENCOUNTER — Encounter: Payer: Self-pay | Admitting: Obstetrics and Gynecology

## 2014-11-17 ENCOUNTER — Other Ambulatory Visit: Payer: Self-pay

## 2014-11-17 ENCOUNTER — Other Ambulatory Visit: Payer: 59

## 2014-11-17 DIAGNOSIS — R7309 Other abnormal glucose: Secondary | ICD-10-CM

## 2014-11-17 DIAGNOSIS — E669 Obesity, unspecified: Secondary | ICD-10-CM

## 2014-11-18 ENCOUNTER — Encounter: Payer: Self-pay | Admitting: Obstetrics and Gynecology

## 2014-11-18 DIAGNOSIS — R7309 Other abnormal glucose: Secondary | ICD-10-CM

## 2014-11-18 LAB — GLUCOSE TOLERANCE, 1 HOUR: GLUCOSE, 1HR PP: 162 mg/dL (ref 65–199)

## 2014-11-18 NOTE — Telephone Encounter (Signed)
-----   Message from Hildred Laser, MD sent at 11/18/2014  2:49 PM EDT ----- Please inform patient that her 1 hr glucola was abnormal 162 (above normal level of 140).  Needs to return for 3 hr testing.

## 2014-11-18 NOTE — Telephone Encounter (Signed)
Pt. Notified by phone and informed to take Tylenol ES as directed and that she had elevated 1 hr. Gtt of 162. Pt will come in am for 3hr testing. Informed to be fasting after midnight.

## 2014-11-18 NOTE — Telephone Encounter (Deleted)
-----   Message from Anika Cherry, MD sent at 11/18/2014  2:49 PM EDT ----- Please inform patient that her 1 hr glucola was abnormal 162 (above normal level of 140).  Needs to return for 3 hr testing. 

## 2014-11-19 ENCOUNTER — Other Ambulatory Visit: Payer: 59

## 2014-11-20 LAB — GESTATIONAL GLUCOSE TOLERANCE
GLUCOSE 3 HOUR GTT: 65 mg/dL (ref 65–139)
Glucose, Fasting: 95 mg/dL — ABNORMAL HIGH (ref 65–94)
Glucose, GTT - 1 Hour: 194 mg/dL — ABNORMAL HIGH (ref 65–179)
Glucose, GTT - 2 Hour: 156 mg/dL — ABNORMAL HIGH (ref 65–154)

## 2014-11-22 NOTE — Telephone Encounter (Signed)
-----   Message from Hildred Laser, MD sent at 11/22/2014 11:46 AM EDT ----- Please inform patient of abnormal 3 hr GTT.  Has gestational diabetes.  Needs to be referred to Diabetes classes.

## 2014-11-22 NOTE — Telephone Encounter (Signed)
Pt notified of elevated 3 hr glucose and need of referral to [facility]. Pt informed that they will contact her for an appt.

## 2014-11-25 ENCOUNTER — Encounter: Payer: 59 | Attending: Internal Medicine | Admitting: *Deleted

## 2014-11-25 ENCOUNTER — Encounter: Payer: Self-pay | Admitting: *Deleted

## 2014-11-25 VITALS — BP 126/70 | Ht 64.0 in | Wt 205.7 lb

## 2014-11-25 DIAGNOSIS — O24419 Gestational diabetes mellitus in pregnancy, unspecified control: Secondary | ICD-10-CM | POA: Diagnosis present

## 2014-11-25 DIAGNOSIS — O24410 Gestational diabetes mellitus in pregnancy, diet controlled: Secondary | ICD-10-CM

## 2014-11-26 ENCOUNTER — Telehealth: Payer: Self-pay | Admitting: Obstetrics and Gynecology

## 2014-11-26 MED ORDER — ONETOUCH DELICA LANCING DEV MISC: 1.0000 | Freq: Four times a day (QID) | Status: DC

## 2014-11-26 MED ORDER — GLUCOSE BLOOD VI STRP: ORAL_STRIP | Status: DC

## 2014-11-26 NOTE — Telephone Encounter (Signed)
Pt needs a refill on her lancets and strips, she is using the one touch ultra blue strips and the lancets one touch delica, she uses the [facility] in [place], she will be completely out after today. [facility] and they gave her enough to last until today.

## 2014-11-26 NOTE — Telephone Encounter (Signed)
Pt notified this was done

## 2014-11-26 NOTE — Progress Notes (Signed)
Diabetes Self-Management Education  Visit Type: First/Initial  Appt. Start Time: 1535 Appt. End Time: 1520  11/26/2014  [name], identified by name and date of birth, is a 29 y.o. female with a diagnosis of Diabetes: Gestational Diabetes.   ASSESSMENT  Blood pressure 126/70, height 5\' 4"  (1.626 m), weight 205 lb 11.2 oz (93.305 kg), last menstrual period 07/29/2014. Body mass index is 35.29 kg/(m^2).      Diabetes Self-Management Education - 11/25/14 1645    Visit Information   Visit Type First/Initial   Initial Visit   Diabetes Type Gestational Diabetes   Are you currently following a meal plan? No   Are you taking your medications as prescribed? Yes   Date Diagnosed last week   Health Coping   How would you rate your overall health? Good   Psychosocial Assessment   Patient Belief/Attitude about Diabetes Afraid  "scared for the baby's heatlh"   Self-care barriers None   Self-management support Doctor's office;Family   Patient Concerns Nutrition/Meal planning;Other (comment);Monitoring;Glycemic Control;Healthy Lifestyle  Healthy baby   Special Needs None   Preferred Learning Style Hands on   Learning Readiness Ready   How often do you need to have someone help you when you read instructions, pamphlets, or other written materials from your doctor or pharmacy? 1 - Never   What is the last grade level you completed in school? Assoc degree   Complications   How often do you check your blood sugar? 0 times/day (not testing)  Provided One Touch Ultra Mini meter and instructed on use. BG upon return demonstration was 160 mg/dL at 1:61 pm - 2 1/2 hrs pp   Have you had a dilated eye exam in the past 12 months? Yes   Have you had a dental exam in the past 12 months? No   Are you checking your feet? No   Dietary Intake   Breakfast sausage or bacon, egg, egg whites or oatmeal, banana, honey, milk   Snack (morning) yogurt or cereal or oatmeal   Lunch meat - chicken or  [place], hamburger, tuna salad   Dinner seafood 2 x week; hamburger, rice   Beverage(s) water, occasional soda   Exercise   Exercise Type ADL's   Patient Education   Previous Diabetes Education No   Disease state  Definition of diabetes, type 1 and 2, and the diagnosis of diabetes   Nutrition management  Role of diet in the treatment of diabetes and the relationship between the three main macronutrients and blood glucose level   Physical activity and exercise  Role of exercise on diabetes management, blood pressure control and cardiac health.   Monitoring Taught/evaluated SMBG meter.;Purpose and frequency of SMBG.;Identified appropriate SMBG and/or A1C goals.   Chronic complications Relationship between chronic complications and blood glucose control   Psychosocial adjustment Identified and addressed patients feelings and concerns about diabetes   Preconception care Pregnancy and GDM  Role of pre-pregnancy blood glucose control on the development of the fetus;Reviewed with patient blood glucose goals with pregnancy   Individualized Goals (developed by patient)   Reducing Risk Improve blood sugars Prevent diabetes complications Lead a healthier lifestyle Healthy baby   Outcomes   Expected Outcomes Demonstrated interest in learning. Expect positive outcomes      Individualized Plan for Diabetes Self-Management Training:   Learning Objective:  Patient will have a greater understanding of diabetes self-management. Patient education plan is to attend individual and/or group sessions per assessed needs and concerns.  Plan:   Patient Instructions  Read booklet on Gestational Diabetes Follow Gestational Meal Planning Guidelines Complete a 3 Day Food Record and bring to next appointment Avoid sugar sweetened beverages Check blood sugars 4 x day - before breakfast and 2 hrs after every meal and record  Call MD for prescription for meter strips and lancets Strips   One Touch Ultra  Blue Lancets  One Touch Delica Bring blood sugar log to next appointment If blood sugars not in control - purchase urine ketone strips Check urine ketones every am:  If + increase bedtime snack to 1 protein and 2 carbohydrate servings Walk 20-30 minutes at least 5 x week if permitted by MD  Expected Outcomes:  Demonstrated interest in learning. Expect positive outcomes  Education material provided:  Gestational Meal Planning Guidelines Viewed Gestational Diabetes Video Meter - One Touch Ultra Mini 3 Day Food Record Goals for a Healthy Pregnancy  If problems or questions, patient to contact team via:   Sharion Settler, RN, CCM, CDE [phone]  Future DSME appointment:  Monday Nov 29, 2014 at 3:30 pm - with Elita Quick (Dietitian)

## 2014-11-26 NOTE — Patient Instructions (Signed)
Read booklet on Gestational Diabetes Follow Gestational Meal Planning Guidelines Complete a 3 Day Food Record and bring to next appointment Avoid sugar sweetened beverages Check blood sugars 4 x day - before breakfast and 2 hrs after every meal and record  Call MD for prescription for meter strips and lancets Strips   One Touch Ultra Blue Lancets  One Touch Delica Bring blood sugar log to next appointment If blood sugars not in control - purchase urine ketone strips Check urine ketones every am:  If + increase bedtime snack to 1 protein and 2 carbohydrate servings Walk 20-30 minutes at least 5 x week if permitted by MD

## 2014-11-29 ENCOUNTER — Encounter: Payer: 59 | Admitting: Dietician

## 2014-11-29 VITALS — BP 110/52 | Ht 64.0 in | Wt 204.3 lb

## 2014-11-29 DIAGNOSIS — O24419 Gestational diabetes mellitus in pregnancy, unspecified control: Secondary | ICD-10-CM | POA: Diagnosis not present

## 2014-11-29 DIAGNOSIS — O24410 Gestational diabetes mellitus in pregnancy, diet controlled: Secondary | ICD-10-CM

## 2014-11-29 NOTE — Progress Notes (Signed)
   Patient's BG record indicates FBGs ranging 86-113 (highest reading after evening snack of peanut butter crackers). Post-meal BGs ranging 71-135 (highest reading after pasta meal at restaurant, ate 1/2)  Patient's food diary indicates some high crab intake, mostly from fruit smoothies, and some low-carb meals. Several meals lack a protein source.    Provided 1700kcal meal plan, and wrote individualized menus based on patient's food preferences.  Discussed importance of protein intake at regular intervals throughout the day and discussed options such as eggs, cheese, peanut butter, lean beef and pork.  Instructed patient on food safety, including avoidance of Listeriosis, and limiting mercury from fish. Patient likes tuna salad, advised her to limit to 2 times a week.   Discussed importance of maintaining healthy lifestyle habits to reduce risk of Type 2 DM as well as Gestational DM with any future pregnancies.  Advised patient to use any remaining testing supplies to test some BGs after delivery, and to have BG tested ideally annually, as well as prior to attempting future pregnancies.

## 2014-11-29 NOTE — Patient Instructions (Signed)
   Include a protein source with all meals; add egg to salad or alongside soup; lean beef or pork is ok if you don't prefer chicken. Low-mercury fish 2 times a week. Beans count as starch and protein, 1/2cup = 1 starch and 1oz protein.  Drink small smoothies to control carbs; try drinking 1/2 of the recipe for each snack.   If your blood sugar drops below 70, drink 4oz juice or regular soda, or 8oz milk. Give it 10-15 minutes to help raise blood sugar, then eat a snack or meal with protein within 30 minutes.

## 2014-12-02 ENCOUNTER — Encounter: Payer: Self-pay | Admitting: Obstetrics and Gynecology

## 2014-12-15 ENCOUNTER — Other Ambulatory Visit: Payer: 59

## 2014-12-15 ENCOUNTER — Encounter: Payer: 59 | Admitting: Obstetrics and Gynecology

## 2014-12-15 DIAGNOSIS — Z3492 Encounter for supervision of normal pregnancy, unspecified, second trimester: Secondary | ICD-10-CM

## 2014-12-21 ENCOUNTER — Ambulatory Visit (INDEPENDENT_AMBULATORY_CARE_PROVIDER_SITE_OTHER): Payer: 59 | Admitting: Obstetrics and Gynecology

## 2014-12-21 VITALS — BP 102/60 | HR 87 | Wt 210.0 lb

## 2014-12-21 DIAGNOSIS — E669 Obesity, unspecified: Secondary | ICD-10-CM

## 2014-12-21 DIAGNOSIS — O10912 Unspecified pre-existing hypertension complicating pregnancy, second trimester: Secondary | ICD-10-CM

## 2014-12-21 DIAGNOSIS — O24414 Gestational diabetes mellitus in pregnancy, insulin controlled: Secondary | ICD-10-CM | POA: Insufficient documentation

## 2014-12-21 DIAGNOSIS — O24410 Gestational diabetes mellitus in pregnancy, diet controlled: Secondary | ICD-10-CM

## 2014-12-21 DIAGNOSIS — Z331 Pregnant state, incidental: Secondary | ICD-10-CM

## 2014-12-21 DIAGNOSIS — I10 Essential (primary) hypertension: Secondary | ICD-10-CM

## 2014-12-21 LAB — POCT URINALYSIS DIPSTICK
Bilirubin, UA: NEGATIVE
Blood, UA: NEGATIVE
Glucose, UA: NEGATIVE
KETONES UA: NEGATIVE
Leukocytes, UA: NEGATIVE
Nitrite, UA: NEGATIVE
PROTEIN UA: NEGATIVE
SPEC GRAV UA: 1.015
UROBILINOGEN UA: NEGATIVE
pH, UA: 6.5

## 2014-12-21 NOTE — Progress Notes (Signed)
Pt declines flu vaccine. Does state she is having nose bleeds.

## 2014-12-21 NOTE — Progress Notes (Signed)
ROB: Denies complaints today.  Does note occasional nose bleeds.  Newly diagnosed GDM, currently attempting management with diet.  Notes that fasting sugars range 95-105, however postprandials are usually below 120 (unless she indulges in a craving, then can get as high as 140-150, which she states she does not do often).  Is mostly doing well with dietary adherence. Continue to encourage strict dietary control.  To bring glucose book in 1 week.  If most values are still elevated, will need to initiate on medication at that time.  Discussed treatment with Glyburide vs insulin including risks vs benefits, patient would prefer Glyburide if meds required. Is s/p anatomy scan with normal anatomy. RTC in 4 weeks for routine OB appointment.

## 2014-12-22 ENCOUNTER — Encounter: Payer: Self-pay | Admitting: Obstetrics and Gynecology

## 2014-12-23 NOTE — Telephone Encounter (Signed)
That can be from lack of moisture. I can suggest sleeping with a cool vaporizor, use normal saline nose spray and if this continues to be a problem let us know. From your visit it does look like this was mentioned. Have a great week end.

## 2014-12-28 ENCOUNTER — Encounter: Payer: 59 | Admitting: Obstetrics and Gynecology

## 2014-12-28 ENCOUNTER — Ambulatory Visit (INDEPENDENT_AMBULATORY_CARE_PROVIDER_SITE_OTHER): Payer: 59 | Admitting: Obstetrics and Gynecology

## 2014-12-28 VITALS — BP 100/63 | HR 78 | Wt 210.1 lb

## 2014-12-28 DIAGNOSIS — Z3492 Encounter for supervision of normal pregnancy, unspecified, second trimester: Secondary | ICD-10-CM

## 2014-12-28 LAB — POCT URINALYSIS DIPSTICK
Bilirubin, UA: NEGATIVE
Blood, UA: NEGATIVE
GLUCOSE UA: NEGATIVE
Ketones, UA: NEGATIVE
Leukocytes, UA: NEGATIVE
NITRITE UA: NEGATIVE
Protein, UA: NEGATIVE
UROBILINOGEN UA: 0.2
pH, UA: 7

## 2014-12-28 MED ORDER — GLYBURIDE 2.5 MG PO TABS: 2.5000 mg | ORAL_TABLET | Freq: Two times a day (BID) | ORAL | Status: DC

## 2014-12-28 NOTE — Progress Notes (Signed)
1 week f/u bs log. Declines AFP.

## 2015-01-03 NOTE — Progress Notes (Signed)
Patient returns for glucose monitoring.  Still noting fastings above 100.  Also with intermittent postprandials (especially at dinner time) ranging in 140s to 150s.  Will iniitate Glyburide 2.5 mg BID.  Given hypoglycemic precautions.  RTC in 2 weeks.

## 2015-01-13 ENCOUNTER — Ambulatory Visit (INDEPENDENT_AMBULATORY_CARE_PROVIDER_SITE_OTHER): Payer: 59 | Admitting: Obstetrics and Gynecology

## 2015-01-13 VITALS — BP 104/65 | HR 85 | Wt 216.8 lb

## 2015-01-13 DIAGNOSIS — O24415 Gestational diabetes mellitus in pregnancy, controlled by oral hypoglycemic drugs: Secondary | ICD-10-CM

## 2015-01-13 DIAGNOSIS — O10912 Unspecified pre-existing hypertension complicating pregnancy, second trimester: Secondary | ICD-10-CM

## 2015-01-13 DIAGNOSIS — I10 Essential (primary) hypertension: Secondary | ICD-10-CM

## 2015-01-13 DIAGNOSIS — E669 Obesity, unspecified: Secondary | ICD-10-CM

## 2015-01-14 ENCOUNTER — Encounter: Payer: Self-pay | Admitting: Obstetrics and Gynecology

## 2015-01-14 NOTE — Telephone Encounter (Signed)
This was responded to in another my chart message sent this pm.

## 2015-01-14 NOTE — Progress Notes (Signed)
ROB: Denies complaints.  Notes compliance with Glyburide. Patient reports that postprandial glucose values are now wnl, however fasting is still mildly elevated 90s-110s, despite further dietary modification.  Will change to Glyburide 5.0 mg in p.m., 2.5 mg in a.m.  Given hypoglycemic precautions.  RTC in 2 weeks. Call back with blood sugars in 1 week.

## 2015-01-17 ENCOUNTER — Telehealth: Payer: Self-pay | Admitting: Obstetrics and Gynecology

## 2015-01-17 DIAGNOSIS — O24419 Gestational diabetes mellitus in pregnancy, unspecified control: Secondary | ICD-10-CM

## 2015-01-17 NOTE — Telephone Encounter (Addendum)
Pt had rice, [place] sausage, oatmeal, water, salad.. BS [id number]. Started 2 days ago. Previous BS 115 etc. Nasal congestion, no fever, no coughing. Eats carbs early of am and walks in afternoon. Pt may want to switch up from sudafed to Claritin or Zyrtec. To contact [facility] regarding blood sugar for recommendations.  Pt voiced understanding.  Can increase tablets currently to 7.5 mg BID (3 tablets twice daily).  Will refer to MFM for further recommendations.

## 2015-01-17 NOTE — Addendum Note (Signed)
Addended by: Fabian NovemberHERRY, Jayelle Page S on: 01/17/2015 05:25 PM   Modules accepted: Orders

## 2015-01-17 NOTE — Telephone Encounter (Signed)
Pt main complaint is that her sugar is out of control. Right now it is 171. She has congestion and says sudafed isn't working. Can she take anything else.

## 2015-01-17 NOTE — Telephone Encounter (Signed)
Please advise that the pills may not be enough to regulate her blood sugars, since they have entered the 200 range, and will likely benefit from insulin injections.

## 2015-01-20 ENCOUNTER — Telehealth: Payer: Self-pay | Admitting: Obstetrics and Gynecology

## 2015-01-20 ENCOUNTER — Telehealth: Payer: Self-pay | Admitting: Dietician

## 2015-01-20 MED ORDER — GLYBURIDE 2.5 MG PO TABS: 2.5000 mg | ORAL_TABLET | Freq: Two times a day (BID) | ORAL | Status: DC

## 2015-01-20 MED ORDER — GLYBURIDE 2.5 MG PO TABS: ORAL_TABLET | ORAL | Status: DC

## 2015-01-20 NOTE — Telephone Encounter (Signed)
She was on the phone with Debbie earlier today, and then she spoke with [facility] afterwards and they said taht Eunice BlaseDebbie wrote in the notes that she is suppose to increase her sugar pill, and Debbi did not mention that, and if that's the case she is going to need another RX because she will not have enough pills, but she wants to make sure that she is suppose to increase, she needs clarification,

## 2015-01-20 NOTE — Addendum Note (Signed)
Addended by: Jackquline DenmarkIDGEWAY, Atianna Haidar W on: 01/20/2015 05:07 PM   Modules accepted: Orders

## 2015-01-20 NOTE — Telephone Encounter (Signed)
After checking pt's EPIC record at MD it was noted that MD advised pt by phone  to increase Glyburide to 7.5 mg BID on 01-18-15. Called pt- pt states "they didn't tell me to do that". Recommended for pt to return to meet with RD for follow-up and review of diet- scheduled FU appt on 01-25-15. Asked pt to bring BG's and food record. Advised pt to call MD to clarify Glyburide dosage.

## 2015-01-20 NOTE — Telephone Encounter (Signed)
[facility] keeping in touch with pt and told her she was not eating enough. Pt states she likes her cheese cake, etc. And does not think she can follow there suggestion. I also mentioned that Dr. Valentino Saxonherry said she might benefit from insulin injections and pt replied maybe she needs to. I informed pt that she would need to follow diet regardless. She states she does not feel well. I informed her that it is better for her and the baby if she follows instructions from [facility]. Pt taking sudafed as instructed and c/o drainage in back of throat but nose clear. May try normal saline spray to see if this helps.

## 2015-01-20 NOTE — Telephone Encounter (Signed)
Pt is to be taking glyburide 2.5mg  tabs #3 tabs po 2xd and was increased per Dr. Valentino Saxonherry. Rx sent to pharmacy and pt aware.

## 2015-01-20 NOTE — Telephone Encounter (Signed)
Received message that pt had called concerning high BG's.  Called pt-pt reports no fever but has sinus drainage, cold and cough and right ear stopped up starting on 01-13-15.  Recent FBG's 98-192, 2 hr pp BG's vary 72-243. Pt called MD last wk. and was told on 01-17-15 to switch from Sudafed to Claritin or Zyrtec and increase Glyburide to 2.5 mg one tablet in AM and 2 tablets in PM. Upon questioning pt about diet pt eats inconsistent carbohydrates at meals and sometimes eats  low carb. Occasionally does not eat a bedtime snack. Asked RD to call pt about diet-RD Connye BurkittPam Ingram called pt but no answer-left message for pt to eat at least 2 carb servings/meal-suggested to eat 30 grams carbs for every meal and snack (6 mini meals). Stressed importance of consistency of carb intake.

## 2015-01-20 NOTE — Addendum Note (Signed)
Addended by: Jackquline DenmarkIDGEWAY, Macon Lesesne W on: 01/20/2015 04:39 PM   Modules accepted: Orders, Medications

## 2015-01-24 ENCOUNTER — Telehealth: Payer: Self-pay | Admitting: Dietician

## 2015-01-24 NOTE — Telephone Encounter (Signed)
left message on 01/20/15 for patient in regards to diet history reported to RN. Advised patient to include at least 2 servings (30g) carbohydrate with each meal, and to consider eating 6 mini-meals rather than larger meals and small snacks. Advised consistent carbohydrate intake, aiming for no more than 45g at one sitting.

## 2015-01-25 ENCOUNTER — Encounter: Payer: 59 | Attending: Internal Medicine | Admitting: Dietician

## 2015-01-25 VITALS — BP 106/58 | Ht 64.0 in | Wt 217.8 lb

## 2015-01-25 DIAGNOSIS — O24419 Gestational diabetes mellitus in pregnancy, unspecified control: Secondary | ICD-10-CM | POA: Insufficient documentation

## 2015-01-25 DIAGNOSIS — O24415 Gestational diabetes mellitus in pregnancy, controlled by oral hypoglycemic drugs: Secondary | ICD-10-CM

## 2015-01-25 NOTE — Patient Instructions (Signed)
   Continue working to keep carb intake as consistent as possible. Have at least 30g carb with each meal or snack, not more than 45g.   Carry some type of low blood sugar treatment with you in case of low blood sugar, under 65. Treatment 15g of quick-acting carb, followed by a balanced meal or snack within 30 minutes to prevent recurrence.

## 2015-01-25 NOTE — Progress Notes (Signed)
Diabetes Self-Management Education  Visit Type:  Follow-up  Appt. Start Time: 1000 Appt. End Time: 1030  01/25/2015  [name], identified by name and date of birth, is a 29 y.o. female with a diagnosis of Diabetes:  .   ASSESSMENT Weight was measured with shoes and jacket. Blood pressure 106/58, height 5\' 4"  (1.626 m), weight 217 lb 12.8 oz (98.793 kg), last menstrual period 07/29/2014. Body mass index is 37.37 kg/(m^2).       Diabetes Self-Management Education - 01/25/15 1034    Complications   How often do you check your blood sugar? 3-4 times/day   Fasting Blood glucose range (mg/dL) 16-109;604-54070-129;130-179  FBGs ranging 118-145 in the past week   Postprandial Blood glucose range (mg/dL) 98-119;147-82970-129;130-179  post-prandial BGs ranging 72-202 over the past week, improved over the past 3 days.    Number of hypoglycemic episodes per month 0   Are you checking your feet? N/A   Exercise   Exercise Type Light (walking / raking leaves)   How many days per week to you exercise? 3   How many minutes per day do you exercise? 15   Total minutes per week of exercise 45   Patient Education   Nutrition management  Other (comment)  importance of consistent carbohydrate intake to achieve consistent BG levels.    Physical activity and exercise  Role of exercise on diabetes management, blood pressure control and cardiac health.   Monitoring Taught/discussed recording of test results and interpretation of SMBG.   Acute complications Taught treatment of hypoglycemia - the 15 rule.   Preconception care Pregnancy and GDM  Role of pre-pregnancy blood glucose control on the development of the fetus;Other (comment)  effects of fatigue, stress, pain on BG control   Outcomes   Program Status Re-entered      Plan:   Patient Instructions   Continue working to keep carb intake as consistent as possible. Have at least 30g carb with each meal or snack, not more than 45g.   Carry some type of low  blood sugar treatment with you in case of low blood sugar, under 65. Treatment 15g of quick-acting carb, followed by a balanced meal or snack within 30 minutes to prevent recurrence.     Expected Outcomes:   BGs seem to be improving as patient's respiratory symptoms improve, and since she has also stopped taking sudafed, although FBGs are still all above 110mg /dl.        Patient understands she will likely need insulin, but states she hates needles.            Education material provided: goals/ instructions  If problems or questions, patient to contact team via:  Phone  Future DSME appointment: -  call if needed

## 2015-01-26 ENCOUNTER — Telehealth: Payer: Self-pay | Admitting: Internal Medicine

## 2015-01-26 NOTE — Telephone Encounter (Signed)
Pt aware.

## 2015-01-26 NOTE — Telephone Encounter (Signed)
Pt called stating she received a no show bill for 07/12/14.  She stated she didn't make that appointment and wanted to know if the no show charge could be waived. Please advise

## 2015-01-26 NOTE — Telephone Encounter (Signed)
I sent a message to charge correction to have this written off.  Please let the patient know.  Thank you!

## 2015-01-27 ENCOUNTER — Encounter: Payer: Self-pay | Admitting: Obstetrics and Gynecology

## 2015-01-31 ENCOUNTER — Telehealth: Payer: Self-pay

## 2015-01-31 DIAGNOSIS — O24419 Gestational diabetes mellitus in pregnancy, unspecified control: Secondary | ICD-10-CM

## 2015-01-31 MED ORDER — GLYBURIDE 2.5 MG PO TABS: ORAL_TABLET | ORAL | Status: DC

## 2015-01-31 NOTE — Telephone Encounter (Signed)
Pt states she is concerned about her high BS. [facility] Had a question about her medications and refills. Concerned about needing insulin. Pt informed I will erx correct amount of meds. Advised pt she needs to see [facility]. Found a referral in her chart form 10/19 where [facility] left a message for her to schedule an appt. Pt was given the number to [facility] and was told to call and schedule an appt. Pt will call back with appt date and time. All her questions where answered.

## 2015-01-31 NOTE — Telephone Encounter (Signed)
After investigating the referral [facility] sent it to [facility]. They contacted her NOT [facility]. TB sent in a referral to [facility]. Pt has an appt for 02/07/2015 at 2:45. Pt is aware.

## 2015-02-04 ENCOUNTER — Encounter: Payer: Self-pay | Admitting: Obstetrics and Gynecology

## 2015-02-07 ENCOUNTER — Telehealth: Payer: Self-pay

## 2015-02-07 ENCOUNTER — Ambulatory Visit
Admission: RE | Admit: 2015-02-07 | Discharge: 2015-02-07 | Disposition: A | Discharge status: Home / Self Care | Payer: 59 | Source: Ambulatory Visit | Attending: Maternal & Fetal Medicine | Admitting: Maternal & Fetal Medicine

## 2015-02-07 ENCOUNTER — Other Ambulatory Visit (INDEPENDENT_AMBULATORY_CARE_PROVIDER_SITE_OTHER): Payer: 59 | Admitting: Obstetrics and Gynecology

## 2015-02-07 ENCOUNTER — Encounter: Payer: Self-pay | Admitting: Obstetrics and Gynecology

## 2015-02-07 VITALS — BP 151/75 | HR 80 | Temp 97.6°F | Ht 64.0 in | Wt 220.1 lb

## 2015-02-07 VITALS — BP 129/77 | HR 92 | Temp 98.1°F | Wt 217.0 lb

## 2015-02-07 DIAGNOSIS — Z3A27 27 weeks gestation of pregnancy: Secondary | ICD-10-CM | POA: Diagnosis not present

## 2015-02-07 DIAGNOSIS — R309 Painful micturition, unspecified: Secondary | ICD-10-CM | POA: Diagnosis not present

## 2015-02-07 DIAGNOSIS — E669 Obesity, unspecified: Secondary | ICD-10-CM

## 2015-02-07 DIAGNOSIS — Z3490 Encounter for supervision of normal pregnancy, unspecified, unspecified trimester: Secondary | ICD-10-CM

## 2015-02-07 DIAGNOSIS — Z331 Pregnant state, incidental: Secondary | ICD-10-CM

## 2015-02-07 DIAGNOSIS — O24913 Unspecified diabetes mellitus in pregnancy, third trimester: Secondary | ICD-10-CM

## 2015-02-07 DIAGNOSIS — O24419 Gestational diabetes mellitus in pregnancy, unspecified control: Secondary | ICD-10-CM | POA: Diagnosis not present

## 2015-02-07 HISTORY — DX: Headache: R51

## 2015-02-07 HISTORY — DX: Headache, unspecified: R51.9

## 2015-02-07 LAB — POCT URINALYSIS DIPSTICK
BILIRUBIN UA: NEGATIVE
KETONES UA: NEGATIVE
NITRITE UA: NEGATIVE
Protein, UA: NEGATIVE
RBC UA: NEGATIVE
Spec Grav, UA: 1.015
Urobilinogen, UA: NEGATIVE
pH, UA: 6.5

## 2015-02-07 LAB — GLUCOSE, CAPILLARY: Glucose-Capillary: 217 mg/dL — ABNORMAL HIGH (ref 65–99)

## 2015-02-07 MED ORDER — NITROFURANTOIN MONOHYD MACRO 100 MG PO CAPS: 100.0000 mg | ORAL_CAPSULE | Freq: Two times a day (BID) | ORAL | Status: DC

## 2015-02-07 NOTE — Progress Notes (Signed)
[facility] Consultation   Chief Complaint: Gestational diabetes with poorly controlled blood sugars.  HPI: [name] is a 29 y.o. G1P0 at 6711w4d by LMP and 6359w6d US who presents in consultation from  Dr. Valentino Saxonherry at [facility] for poorly controlled blood sugars and need for conversion to insulin.  Gynecologic History:   History of abnormal Pap.  Last Pap normal in 2013.  Past Medical History: Patient  has a past medical history of Anxiety; Hypertension; Chronic hypertension complicating or reason for care during pregnancy (10/14/2014); and Headache.   Past Surgical History: She  has past surgical history that includes No past surgeries.   Medications:  Prenatal vitamins Glyburide 7.5 mg bid ASA 81 mg qd  Allergies: Patient has No Known Allergies.   Social History: Patient  reports that she has never smoked. She has never used smokeless tobacco. She reports that she does not drink alcohol or use illicit drugs.  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  Her husband works for [employer] [place]  Family History: family history includes Cancer in her paternal grandfather; Diabetes in her maternal grandfather, maternal grandmother, and paternal grandmother; Gestational diabetes in her sister; Heart disease in her maternal aunt, maternal grandmother, and paternal grandmother; Hypertension in her paternal grandfather and paternal grandmother.   Review of Systems A full 12 point review of systems was negative or as noted in the History of Present Illness.  Blood sugar review:  FBSs 127-186  With 7/7 > 95 2 hr PP breakfast 227-294 With 7/7 > 120 2 hr PP lunch 158-295  With 4/4 > 120 2 hr PP dinner 94-198  With 2/3 > 120  Physical Exam: BP 129/77 mmHg  Pulse 92  Temp(Src) 98.1 F (36.7 C)  Wt 217 lb (98.431 kg)  LMP 07/29/2014  Body mass index is 37.23 kg/(m^2).   Asessement:  G1P0 at 1211w4d by LMP and 359w6d US with obesity and  poorly controlled  blood sugars.  Recommendations: 1.  We discussed the effects of her diabetes on the pregnancy and the fetus as well. We reviewed the increased risk of  fetal growth disturbances, fetal death, and neonatal metabolic complications and the importance of good glucose control to minimize these risks. We discussed increasing insulin requirements with advancing pregnancy and the need for frequent adjustment of insulin dosages to meet these needs.  2.  She has previously been to [facility] for diet instruction and instruction in blood sugar monitoring.  3.  She was counseled that our goals for BS are fasting blood glucose between 60 and 95 mg/dl; and two hour postprandial blood glucoses between 70 and  120 mg/dl. 4.  I explained that I expect that she would ultimately require approximately 1 unit per kg (98 units) of insulin per day, but we were going to start with half of that:  Lantus or Levimir 25 units hs and Humalog or Novolog 8 units with meals.  She was instructed in insulin self-administration.  Prescriptions for insulin, needles and syringes to her pharmacy.  She is to return here in 1 week and again in 2 weeks for us to review her blood sugars.   5.  She barely failed her GTT in the early second trimester.  I doubt the fetus is at increased risk of congenital anomalies, but because the blood sugar has been poorly controlled subsequently, the fetus is at risk of hypertrophic abnormalities.  I offered her a fetal echo.  She will do this  tomorrow.  6.  With respect to fetal surveillance, we would recommend ultrasound for fetal growth every 4 weeks starting now.  The first one will be scheduled here. 7. We would recommend twice weekly nonstress tests beginning at [redacted] weeks gestation  8. Delivery planning will be determined once the patient is closer to her estimated due date. This will be determined by:    A. Fetal size;    B. Amniotic fluid volume;    C. Level of glucose control;    D. Other medical or  obstetrical complications. She was counseled that if there if fetal macrosomia or polyhydramnios, we would recommend delivery at 37 weeks.  We will be happy to be involved in the ongoing care of [name] .  Total time spent with the patient was 40 minutes with greater than 50% spent in counseling and coordination of care.   Argentina Ponder, MD [facility]

## 2015-02-07 NOTE — Progress Notes (Signed)
Pt comes by office as suggested per email. Has back pain and pain when voids (tenderness). Urinalysis done and urine sent for culture. Urinalysis results: large glucose, and trace leuks. Informed pt that a prescription will be sent in [facility] and she may take AZO (otc). Asked pt to check on her visit this afternoon with [facility] for [facility], if AZO would be okay due to her diabetes, that this is safe for pregnancy.

## 2015-02-08 DIAGNOSIS — O24419 Gestational diabetes mellitus in pregnancy, unspecified control: Secondary | ICD-10-CM | POA: Insufficient documentation

## 2015-02-10 ENCOUNTER — Ambulatory Visit (INDEPENDENT_AMBULATORY_CARE_PROVIDER_SITE_OTHER): Payer: 59 | Admitting: Obstetrics and Gynecology

## 2015-02-10 VITALS — BP 117/71 | HR 94 | Wt 220.0 lb

## 2015-02-10 DIAGNOSIS — Z23 Encounter for immunization: Secondary | ICD-10-CM | POA: Diagnosis not present

## 2015-02-10 DIAGNOSIS — Z3493 Encounter for supervision of normal pregnancy, unspecified, third trimester: Secondary | ICD-10-CM

## 2015-02-10 LAB — POCT URINALYSIS DIPSTICK
Bilirubin, UA: NEGATIVE
Glucose, UA: NEGATIVE
KETONES UA: NEGATIVE
NITRITE UA: NEGATIVE
PH UA: 7.5
PROTEIN UA: NEGATIVE
Spec Grav, UA: 1.01
Urobilinogen, UA: NEGATIVE

## 2015-02-10 LAB — URINE CULTURE, OB REFLEX

## 2015-02-10 LAB — CULTURE, OB URINE

## 2015-02-10 NOTE — Progress Notes (Signed)
ROB: Patient doing well.  Is s/p consultation with [facility] for uncontrolled gestational DM, who have placed patient on insulin regimen (Novolog 8 units 3 x daily with meals, and Lantus 25 units qhs).  Patient just initiated regimen 2 days ago.  Notes that overall blood sugars are decreasing, however lunch and dinner levels still in low 200s (although improving from high 200s). Notes fastings are improving as well.  [facility] include twice weekly antenatal testing starting at 32 weeks, growth scans q 4 weeks beginning next week.  Had normal fetal echo performed last week. Recommend delivery for polyhydramnios or macrosomia at 37 weeks. Otherwise, can deliver based on maternal and fetal status after 37 weeks. RTC in 2 weeks.  Hgb/Hct ordered, Tdap given today.

## 2015-02-10 NOTE — Patient Instructions (Signed)
Fetal Movement Counts  Patient Name: __________________________________________________ Patient Due Date: ____________________  Performing a fetal movement count is highly recommended in high-risk pregnancies, but it is good for every pregnant woman to do. Your health care provider may ask you to start counting fetal movements at 28 weeks of the pregnancy. Fetal movements often increase:  · After eating a full meal.  · After physical activity.  · After eating or drinking something sweet or cold.  · At rest.  Pay attention to when you feel the baby is most active. This will help you notice a pattern of your baby's sleep and wake cycles and what factors contribute to an increase in fetal movement. It is important to perform a fetal movement count at the same time each day when your baby is normally most active.   HOW TO COUNT FETAL MOVEMENTS  1. Find a quiet and comfortable area to sit or lie down on your left side. Lying on your left side provides the best blood and oxygen circulation to your baby.  2. Write down the day and time on a sheet of paper or in a journal.  3. Start counting kicks, flutters, swishes, rolls, or jabs in a 2-hour period. You should feel at least 10 movements within 2 hours.  4. If you do not feel 10 movements in 2 hours, wait 2-3 hours and count again. Look for a change in the pattern or not enough counts in 2 hours.  SEEK MEDICAL CARE IF:  · You feel less than 10 counts in 2 hours, tried twice.  · There is no movement in over an hour.  · The pattern is changing or taking longer each day to reach 10 counts in 2 hours.  · You feel the baby is not moving as he or she usually does.  Date: ____________ Movements: ____________ Start time: ____________ Finish time: ____________   Date: ____________ Movements: ____________ Start time: ____________ Finish time: ____________  Date: ____________ Movements: ____________ Start time: ____________ Finish time: ____________  Date: ____________ Movements:  ____________ Start time: ____________ Finish time: ____________  Date: ____________ Movements: ____________ Start time: ____________ Finish time: ____________  Date: ____________ Movements: ____________ Start time: ____________ Finish time: ____________  Date: ____________ Movements: ____________ Start time: ____________ Finish time: ____________  Date: ____________ Movements: ____________ Start time: ____________ Finish time: ____________   Date: ____________ Movements: ____________ Start time: ____________ Finish time: ____________  Date: ____________ Movements: ____________ Start time: ____________ Finish time: ____________  Date: ____________ Movements: ____________ Start time: ____________ Finish time: ____________  Date: ____________ Movements: ____________ Start time: ____________ Finish time: ____________  Date: ____________ Movements: ____________ Start time: ____________ Finish time: ____________  Date: ____________ Movements: ____________ Start time: ____________ Finish time: ____________  Date: ____________ Movements: ____________ Start time: ____________ Finish time: ____________   Date: ____________ Movements: ____________ Start time: ____________ Finish time: ____________  Date: ____________ Movements: ____________ Start time: ____________ Finish time: ____________  Date: ____________ Movements: ____________ Start time: ____________ Finish time: ____________  Date: ____________ Movements: ____________ Start time: ____________ Finish time: ____________  Date: ____________ Movements: ____________ Start time: ____________ Finish time: ____________  Date: ____________ Movements: ____________ Start time: ____________ Finish time: ____________  Date: ____________ Movements: ____________ Start time: ____________ Finish time: ____________   Date: ____________ Movements: ____________ Start time: ____________ Finish time: ____________  Date: ____________ Movements: ____________ Start time: ____________ Finish  time: ____________  Date: ____________ Movements: ____________ Start time: ____________ Finish time: ____________  Date: ____________ Movements: ____________ Start time:   ____________ Finish time: ____________  Date: ____________ Movements: ____________ Start time: ____________ Finish time: ____________  Date: ____________ Movements: ____________ Start time: ____________ Finish time: ____________  Date: ____________ Movements: ____________ Start time: ____________ Finish time: ____________   Date: ____________ Movements: ____________ Start time: ____________ Finish time: ____________  Date: ____________ Movements: ____________ Start time: ____________ Finish time: ____________  Date: ____________ Movements: ____________ Start time: ____________ Finish time: ____________  Date: ____________ Movements: ____________ Start time: ____________ Finish time: ____________  Date: ____________ Movements: ____________ Start time: ____________ Finish time: ____________  Date: ____________ Movements: ____________ Start time: ____________ Finish time: ____________  Date: ____________ Movements: ____________ Start time: ____________ Finish time: ____________   Date: ____________ Movements: ____________ Start time: ____________ Finish time: ____________  Date: ____________ Movements: ____________ Start time: ____________ Finish time: ____________  Date: ____________ Movements: ____________ Start time: ____________ Finish time: ____________  Date: ____________ Movements: ____________ Start time: ____________ Finish time: ____________  Date: ____________ Movements: ____________ Start time: ____________ Finish time: ____________  Date: ____________ Movements: ____________ Start time: ____________ Finish time: ____________  Date: ____________ Movements: ____________ Start time: ____________ Finish time: ____________   Date: ____________ Movements: ____________ Start time: ____________ Finish time: ____________  Date: ____________  Movements: ____________ Start time: ____________ Finish time: ____________  Date: ____________ Movements: ____________ Start time: ____________ Finish time: ____________  Date: ____________ Movements: ____________ Start time: ____________ Finish time: ____________  Date: ____________ Movements: ____________ Start time: ____________ Finish time: ____________  Date: ____________ Movements: ____________ Start time: ____________ Finish time: ____________  Date: ____________ Movements: ____________ Start time: ____________ Finish time: ____________   Date: ____________ Movements: ____________ Start time: ____________ Finish time: ____________  Date: ____________ Movements: ____________ Start time: ____________ Finish time: ____________  Date: ____________ Movements: ____________ Start time: ____________ Finish time: ____________  Date: ____________ Movements: ____________ Start time: ____________ Finish time: ____________  Date: ____________ Movements: ____________ Start time: ____________ Finish time: ____________  Date: ____________ Movements: ____________ Start time: ____________ Finish time: ____________     This information is not intended to replace advice given to you by your health care provider. Make sure you discuss any questions you have with your health care provider.     Document Released: 04/18/2006 Document Revised: 04/09/2014 Document Reviewed: 01/14/2012  Elsevier Interactive Patient Education ©2016 [employer] Inc[employer]

## 2015-02-12 ENCOUNTER — Encounter: Payer: Self-pay | Admitting: Obstetrics and Gynecology

## 2015-02-15 ENCOUNTER — Encounter: Payer: Self-pay | Admitting: Obstetrics and Gynecology

## 2015-02-16 ENCOUNTER — Other Ambulatory Visit: Payer: Self-pay

## 2015-02-16 DIAGNOSIS — O24414 Gestational diabetes mellitus in pregnancy, insulin controlled: Secondary | ICD-10-CM

## 2015-02-17 ENCOUNTER — Ambulatory Visit: Payer: Self-pay

## 2015-02-17 ENCOUNTER — Ambulatory Visit
Admission: RE | Admit: 2015-02-17 | Discharge: 2015-02-17 | Disposition: A | Discharge status: Home / Self Care | Payer: 59 | Source: Ambulatory Visit | Attending: Obstetrics and Gynecology | Admitting: Obstetrics and Gynecology

## 2015-02-17 ENCOUNTER — Other Ambulatory Visit: Payer: Self-pay

## 2015-02-17 ENCOUNTER — Ambulatory Visit (HOSPITAL_BASED_OUTPATIENT_CLINIC_OR_DEPARTMENT_OTHER)
Admission: RE | Admit: 2015-02-17 | Discharge: 2015-02-17 | Disposition: A | Discharge status: Home / Self Care | Payer: 59 | Source: Ambulatory Visit | Attending: Obstetrics and Gynecology | Admitting: Obstetrics and Gynecology

## 2015-02-17 ENCOUNTER — Other Ambulatory Visit: Payer: Self-pay | Admitting: Maternal & Fetal Medicine

## 2015-02-17 VITALS — BP 120/64 | HR 100 | Temp 98.1°F | Ht 64.8 in | Wt 220.6 lb

## 2015-02-17 DIAGNOSIS — I10 Essential (primary) hypertension: Secondary | ICD-10-CM | POA: Diagnosis not present

## 2015-02-17 DIAGNOSIS — Z3A29 29 weeks gestation of pregnancy: Secondary | ICD-10-CM | POA: Insufficient documentation

## 2015-02-17 DIAGNOSIS — O10913 Unspecified pre-existing hypertension complicating pregnancy, third trimester: Secondary | ICD-10-CM | POA: Diagnosis not present

## 2015-02-17 DIAGNOSIS — O24414 Gestational diabetes mellitus in pregnancy, insulin controlled: Secondary | ICD-10-CM

## 2015-02-17 DIAGNOSIS — O24419 Gestational diabetes mellitus in pregnancy, unspecified control: Secondary | ICD-10-CM

## 2015-02-17 DIAGNOSIS — E669 Obesity, unspecified: Secondary | ICD-10-CM | POA: Diagnosis not present

## 2015-02-17 DIAGNOSIS — E66811 Obesity, class 1: Secondary | ICD-10-CM

## 2015-02-17 MED ORDER — GLUCOSE BLOOD VI STRP: 1.0000 | ORAL_STRIP | Status: DC

## 2015-02-17 NOTE — Addendum Note (Signed)
Encounter addended by: Jimmey RalphElizabeth Princesa Willig, MD on: 02/17/2015  4:33 PM<BR>     Documentation filed: Notes Section, Problem List

## 2015-02-17 NOTE — Progress Notes (Signed)
I spoke to the patient - [facility] change  Dr Maryclare BeanHeine increased her insulin to  Humalog 15 units before each meal  45 units lantus at bedtime   I suggested to the pt by phone that she use 2 extra units of humalog for every 50 mg /dl above [id number] on her 2 h pp sugar - so 2 units 150-199 , 4 units 200-250 , 6 untis 250-300 call MFM on call if > 300 She has f.u on 11/29   Consider adding metformin to regimen if not  Near euglycemic  at 100units /day  Jimmey RalphLivingston, Brittany Zaccaro, MD

## 2015-02-17 NOTE — Progress Notes (Signed)
29 yo G1 p0 at 29 weeks - GDMA2 on insulin with poorly controlled sugars  On growth scan today 93rd Percentile  Cervix noted to be widely funneled and closed portion 11mm  [facility] for r/o premature labor and glycemic control. Brittany RalphLivingston, Brittany Galyean, MD

## 2015-02-21 ENCOUNTER — Telehealth: Payer: Self-pay

## 2015-02-21 NOTE — Telephone Encounter (Signed)
See my chart message

## 2015-02-23 ENCOUNTER — Ambulatory Visit (INDEPENDENT_AMBULATORY_CARE_PROVIDER_SITE_OTHER): Payer: 59 | Admitting: Obstetrics and Gynecology

## 2015-02-23 VITALS — BP 120/80 | HR 105 | Wt 219.0 lb

## 2015-02-23 DIAGNOSIS — E669 Obesity, unspecified: Secondary | ICD-10-CM

## 2015-02-23 DIAGNOSIS — O3433 Maternal care for cervical incompetence, third trimester: Secondary | ICD-10-CM

## 2015-02-23 DIAGNOSIS — O24419 Gestational diabetes mellitus in pregnancy, unspecified control: Secondary | ICD-10-CM

## 2015-02-23 DIAGNOSIS — Z3490 Encounter for supervision of normal pregnancy, unspecified, unspecified trimester: Secondary | ICD-10-CM

## 2015-02-23 DIAGNOSIS — O24414 Gestational diabetes mellitus in pregnancy, insulin controlled: Secondary | ICD-10-CM

## 2015-02-23 DIAGNOSIS — E66811 Obesity, class 1: Secondary | ICD-10-CM

## 2015-02-23 DIAGNOSIS — Z331 Pregnant state, incidental: Secondary | ICD-10-CM

## 2015-02-23 LAB — POCT URINALYSIS DIPSTICK
BILIRUBIN UA: NEGATIVE
Blood, UA: NEGATIVE
Leukocytes, UA: NEGATIVE
Nitrite, UA: NEGATIVE
PROTEIN UA: NEGATIVE
SPEC GRAV UA: 1.01
Urobilinogen, UA: NEGATIVE
pH, UA: 6

## 2015-02-23 NOTE — Progress Notes (Signed)
Pt c/o abdominal pain, lower back pain, and pelvic pain. (Treated by [facility] for yeast infection) Last night she felt the urge to have bowel movement and urinate x3 but could not.  Blood sugars ranges per pt are in the 180's and sometimes as low as 115. Did not bring chart for BS. Lantus insulin increased to 55 units daily and Novolog 20 units 3xd.

## 2015-02-23 NOTE — Progress Notes (Signed)
ROB: Patient c/o pelvic pressure, lower back pain, abdominal pain.  Felt urge to have BM 3 times but couldn't.  Notes normal BMs.  Cervix noted to have funneling on [facility], [facility] for management of sugars and antenatal steroids.  Reassurance given.  Discussion had regarding delivery plans, likely will need delivery at 37 weeks.  Patient still inquires about elective primary C-section, discussed risks of surgery and sequelae for future pregnancies.  Discussed pain control with epidural and IV meds for vaginal delivery. RTC in 2 weeks.  Has appt with [facility] on Monday.

## 2015-02-28 ENCOUNTER — Ambulatory Visit
Admission: RE | Admit: 2015-02-28 | Discharge: 2015-02-28 | Disposition: A | Discharge status: Home / Self Care | Payer: 59 | Source: Ambulatory Visit | Attending: Obstetrics & Gynecology | Admitting: Obstetrics & Gynecology

## 2015-02-28 VITALS — BP 116/54 | HR 97 | Temp 97.4°F | Wt 219.0 lb

## 2015-02-28 DIAGNOSIS — O3433 Maternal care for cervical incompetence, third trimester: Secondary | ICD-10-CM | POA: Diagnosis not present

## 2015-02-28 DIAGNOSIS — E669 Obesity, unspecified: Secondary | ICD-10-CM

## 2015-02-28 DIAGNOSIS — O24419 Gestational diabetes mellitus in pregnancy, unspecified control: Secondary | ICD-10-CM

## 2015-02-28 DIAGNOSIS — I10 Essential (primary) hypertension: Secondary | ICD-10-CM

## 2015-02-28 DIAGNOSIS — O10913 Unspecified pre-existing hypertension complicating pregnancy, third trimester: Secondary | ICD-10-CM | POA: Diagnosis not present

## 2015-02-28 DIAGNOSIS — O24414 Gestational diabetes mellitus in pregnancy, insulin controlled: Secondary | ICD-10-CM

## 2015-02-28 NOTE — Progress Notes (Signed)
MFM follow-up consultation: [name] is a 29 year-old G1 P0 at 3030 4/7 weeks here to review her sugar log. [facility]  No contractions on the monitor and no cervical change. She received antenatal corticosteroids.  [facility] [place] who recommended that she increase her insulin to:  Lantus 55 and Humalog 20/20/20 and then today, [facility] called her and recommending increasing to Lantus 60 (divided 30 am and 30 pm) and Humalog 22/20/20.  Her sugars have begun to improve following the high values she had during her steroid administration on 11/17 and 11/18.  She reports good fetal movement and denies contractions and abdominal pain.  Normal anatomy scan and normal fetal echo.  Taking a daily baby aspirin.   She has a history of chronic hypertension but has not required medications this pregnancy.   Exam: BP: 149/67, 116/54  Sugar log (11/22-11/28/16): FS: 108, 116, 87, 106, 107, 95, 84 2hr PP breakfast: 289, 165, 91, 170, 169, 128, 146 2hr PP lunch: 151, 153, 231, 101, 133, 77, 126 2hr PP dinner: 185, 114, 118, 173, 60, 116  US (02/17/15): EFW 1668 (93%), AC 3 weeks ahead. AFI 15.2, cephalic  -Insulin change:  Insulin changes as outlined above. Increase Lantus to 60 Units daily (divided 30 units in am and 30 units pm) and Humalog 22/20/20 -BPP 8/8 today, AFI 16.3, cephalic -Scheduled growth scan and BPP to be done in 2 weeks. Start fetal testing at that time -Given risk for preterm delivery (short cervix) as well as high doses of insulin that are required and need for insulin drip during labor, we will be happy to take on her care and [facility] [place]. Please let us know if you'd like us to transfer her care.  Keona Sheffler, [place] A, MD

## 2015-02-28 NOTE — Addendum Note (Signed)
Encounter addended by: [place]had Clayvon Parlett, MD on: 02/28/2015  4:26 PM<BR>     Documentation filed: Episodes

## 2015-03-02 ENCOUNTER — Encounter: Payer: Self-pay | Admitting: Obstetrics and Gynecology

## 2015-03-04 ENCOUNTER — Encounter: Payer: Self-pay | Admitting: Obstetrics and Gynecology

## 2015-03-06 ENCOUNTER — Encounter: Payer: Self-pay | Admitting: Obstetrics and Gynecology

## 2015-03-07 ENCOUNTER — Ambulatory Visit
Admission: RE | Admit: 2015-03-07 | Discharge: 2015-03-07 | Disposition: A | Discharge status: Home / Self Care | Payer: 59 | Source: Ambulatory Visit | Attending: Obstetrics & Gynecology | Admitting: Obstetrics & Gynecology

## 2015-03-07 VITALS — BP 141/69 | HR 87 | Temp 98.1°F | Wt 220.0 lb

## 2015-03-07 DIAGNOSIS — O10913 Unspecified pre-existing hypertension complicating pregnancy, third trimester: Secondary | ICD-10-CM

## 2015-03-07 DIAGNOSIS — O24419 Gestational diabetes mellitus in pregnancy, unspecified control: Secondary | ICD-10-CM

## 2015-03-07 DIAGNOSIS — E669 Obesity, unspecified: Secondary | ICD-10-CM

## 2015-03-07 DIAGNOSIS — O24414 Gestational diabetes mellitus in pregnancy, insulin controlled: Secondary | ICD-10-CM

## 2015-03-07 DIAGNOSIS — I10 Essential (primary) hypertension: Secondary | ICD-10-CM | POA: Diagnosis not present

## 2015-03-07 MED ORDER — INSULIN ASPART 100 UNIT/ML FLEXPEN: 24.0000 [IU] | PEN_INJECTOR | Freq: Three times a day (TID) | SUBCUTANEOUS | Status: DC

## 2015-03-07 MED ORDER — INSULIN GLARGINE 100 UNITS/ML SOLOSTAR PEN: PEN_INJECTOR | SUBCUTANEOUS | Status: DC

## 2015-03-07 NOTE — Progress Notes (Addendum)
MFM Follow-up consultation: 31 4/7 weeks here to review her sugar log.  Due to increasing insulin requirements, at last visit her Lantus does was divided (see MFM f/u consult note dates 02/28/15). Currently taking Lantus 30 Units in am and 30 units in pm and Humalog 22/20/20. Had an episode of contractions over the weekend. Also not sure if leaking fluid.  Has on and off episodes.  Exam: Filed Vitals:   03/07/15 1116  BP: 141/69  Pulse: 87  Temp: 98.1 F (36.7 C)   SSE: no pooling. Nitrazine neg SVE: 1/50/high, soft and mid position  FS: 84, 83, 93, 108, 103, 87, 97, 104 2hr PP breakfast: 146, 183, 185, 127, 173, 119, 129, 160 2hr PP lunch: 126, 106, 136, 120, 142, 144, 102 2hr PP dinner: 82, 54, 78, 89, 214  US (02/17/15): EFW 1668(93%), AC 3 weeks ahead, AFI 15.2 Cephalic US (02/28/15): AFI 16.3, cephalic, BPP 8/8  Sugars under much better control.  Received steroids for short cervix on 11/17-11/18 -Increase Lantus to 34 Units in am and 34 units in pm and Humalog 24/22/22 -Return in one week for growth scan and BPP. Then start fetal testing -No evidence of SROM on exam. Cervix stable. Received steroids. Continue pelvic rest. Discussed signs/symptoms of preterm labor. -Provided refills on insulin -We will be happy to assume care of patient if desired.  Please let us know if you would like us to transfer there care to [facility] [place]  Dania Marsan, [place]had A, MD

## 2015-03-07 NOTE — Addendum Note (Signed)
Encounter addended by: [place]had Krithi Bray, MD on: 03/07/2015 12:22 PM<BR>     Documentation filed: Notes Section

## 2015-03-09 ENCOUNTER — Encounter: Payer: Self-pay | Admitting: Obstetrics and Gynecology

## 2015-03-09 ENCOUNTER — Ambulatory Visit (INDEPENDENT_AMBULATORY_CARE_PROVIDER_SITE_OTHER): Payer: 59 | Admitting: Obstetrics and Gynecology

## 2015-03-09 VITALS — BP 109/67 | HR 80 | Wt 224.0 lb

## 2015-03-09 DIAGNOSIS — O99210 Obesity complicating pregnancy, unspecified trimester: Secondary | ICD-10-CM

## 2015-03-09 DIAGNOSIS — O24414 Gestational diabetes mellitus in pregnancy, insulin controlled: Secondary | ICD-10-CM

## 2015-03-09 DIAGNOSIS — Z3483 Encounter for supervision of other normal pregnancy, third trimester: Secondary | ICD-10-CM

## 2015-03-09 DIAGNOSIS — Z3493 Encounter for supervision of normal pregnancy, unspecified, third trimester: Secondary | ICD-10-CM

## 2015-03-09 LAB — POCT URINALYSIS DIPSTICK
BILIRUBIN UA: NEGATIVE
GLUCOSE UA: 100
Ketones, UA: NEGATIVE
LEUKOCYTES UA: NEGATIVE
NITRITE UA: NEGATIVE
Protein, UA: NEGATIVE
Spec Grav, UA: 1.015
Urobilinogen, UA: NEGATIVE
pH, UA: 6.5

## 2015-03-09 NOTE — Progress Notes (Signed)
ROB: Patient with no complaints today.  To be seen by [facility] next Monday for growth scan.  Will also need to begin NSTs at that time.  Continued discussed risks vs benefits of elective primary C-section, addressed concerns regarding fear of labor pains and management, handouts given.  Notes BPs elevated at [facility] last visit, but wnl today.  Will continue to monitor.  To plan for scheduled IOL at 37 weeks (~ 04/13/2014) unless otherwise indicated.

## 2015-03-10 ENCOUNTER — Encounter: Payer: Self-pay | Admitting: Obstetrics and Gynecology

## 2015-03-12 ENCOUNTER — Encounter: Payer: Self-pay | Admitting: Obstetrics and Gynecology

## 2015-03-14 ENCOUNTER — Other Ambulatory Visit: Payer: Self-pay | Admitting: Maternal & Fetal Medicine

## 2015-03-14 ENCOUNTER — Ambulatory Visit
Admission: RE | Admit: 2015-03-14 | Discharge: 2015-03-14 | Disposition: A | Discharge status: Home / Self Care | Payer: 59 | Source: Ambulatory Visit | Attending: Maternal & Fetal Medicine | Admitting: Maternal & Fetal Medicine

## 2015-03-14 ENCOUNTER — Ambulatory Visit (HOSPITAL_BASED_OUTPATIENT_CLINIC_OR_DEPARTMENT_OTHER)
Admission: RE | Admit: 2015-03-14 | Discharge: 2015-03-14 | Disposition: A | Discharge status: Home / Self Care | Payer: 59 | Source: Ambulatory Visit | Attending: Maternal & Fetal Medicine | Admitting: Maternal & Fetal Medicine

## 2015-03-14 VITALS — BP 131/75 | HR 91 | Temp 98.0°F | Wt 226.0 lb

## 2015-03-14 DIAGNOSIS — O24414 Gestational diabetes mellitus in pregnancy, insulin controlled: Secondary | ICD-10-CM

## 2015-03-14 DIAGNOSIS — Z3A32 32 weeks gestation of pregnancy: Secondary | ICD-10-CM | POA: Diagnosis not present

## 2015-03-14 DIAGNOSIS — IMO0001 Reserved for inherently not codable concepts without codable children: Secondary | ICD-10-CM

## 2015-03-14 DIAGNOSIS — O3433 Maternal care for cervical incompetence, third trimester: Secondary | ICD-10-CM | POA: Diagnosis not present

## 2015-03-14 DIAGNOSIS — E669 Obesity, unspecified: Secondary | ICD-10-CM

## 2015-03-14 DIAGNOSIS — E66811 Obesity, class 1: Secondary | ICD-10-CM

## 2015-03-14 DIAGNOSIS — Z3A34 34 weeks gestation of pregnancy: Secondary | ICD-10-CM | POA: Insufficient documentation

## 2015-03-14 DIAGNOSIS — O3660X0 Maternal care for excessive fetal growth, unspecified trimester, not applicable or unspecified: Secondary | ICD-10-CM | POA: Insufficient documentation

## 2015-03-14 DIAGNOSIS — O0992 Supervision of high risk pregnancy, unspecified, second trimester: Secondary | ICD-10-CM | POA: Insufficient documentation

## 2015-03-14 DIAGNOSIS — O24419 Gestational diabetes mellitus in pregnancy, unspecified control: Secondary | ICD-10-CM

## 2015-03-14 DIAGNOSIS — O3663X0 Maternal care for excessive fetal growth, third trimester, not applicable or unspecified: Secondary | ICD-10-CM

## 2015-03-14 NOTE — Progress Notes (Addendum)
[facility] Follow-up Consultation   Chief Complaint: Gestational diabetes with poorly controlled blood sugars.   HPI: Brittany White is a 29 y.o. G1P0 at 8433w4d by LMP and 6657w6d US who was originally referred by Dr. Valentino White at [facility] for poorly controlled blood sugars and need for conversion to insulin.   Past Medical History: Patient has a past medical history of Anxiety; Hypertension; Chronic hypertension complicating or reason for care during pregnancy (10/14/2014); and Headache.   Past Surgical History: She has past surgical history that includes No past surgeries.   Medications:  Prenatal vitamins  ASA 81 mg qd  Lantus 34 Units in am and 34 units in pm and Humalog 24/22/22  Allergies: Patient has No Known Allergies.   Blood sugar review:  FBSs 79-106 with 3/7 > 95  2 hr PP breakfast 74-265 with 5/7 > 120  2 hr PP lunch 75-265 With 4/6 > 120  2 hr PP dinner 56-160 with 1/4 > 120 - one low of 56, another value of 66  Physical Exam:  T 98 HR 91   BP 131/75  O2 Sat 99%  WT 226  US today - cephalic, EFW 2841 g which is > 97th %tile, AFI = 9.9 cm, MVP = 4.9 cm, fetal anatomy was normal or visualized previously, BPP = 8/8.  Fetal echo 02/08/2015 - normal  Asessement:  G1P0 at 4633w4d by LMP and 6057w6d US with obesity, poorly controlled blood sugars and cervical funneling S/P steroids   Recommendations:   1. I have recommended that she increase her Lantus to 36 units every AM and PM, increase her morning Humalog to 26 units, her lunchtime Humalog to 24 units and decrease her evening Humalog to 16 units. 2. With respect to monitoring of fetal growth, we have recommended ultrasounds for fetal growth every 4 weeks. The next one was scheduled here 04/10/2014.  3. We have recommended twice weekly testing  with weekly AFI starting now. The patient is having a weekly BPP and a weekly NST  [facility].  4. Delivery planning - The patient is scheduled for  delivery at [redacted] weeks gestation (04/13/2014).  Mode of delivery may be informed by the next US for growth scheduled for 04/10/2014.     Total time spent with the patient was 30 minutes with greater than 50% spent in counseling and coordination of care.   Argentina PonderAndra H. Kjersti Dittmer, MD  [facility]

## 2015-03-17 ENCOUNTER — Ambulatory Visit
Admission: RE | Admit: 2015-03-17 | Discharge: 2015-03-17 | Disposition: A | Discharge status: Home / Self Care | Payer: 59 | Source: Ambulatory Visit | Attending: Maternal & Fetal Medicine | Admitting: Maternal & Fetal Medicine

## 2015-03-17 ENCOUNTER — Other Ambulatory Visit: Payer: Self-pay

## 2015-03-17 VITALS — BP 132/71 | HR 75 | Temp 98.4°F | Resp 18 | Ht 64.8 in | Wt 227.4 lb

## 2015-03-17 DIAGNOSIS — Z3A33 33 weeks gestation of pregnancy: Secondary | ICD-10-CM | POA: Diagnosis not present

## 2015-03-17 DIAGNOSIS — E669 Obesity, unspecified: Secondary | ICD-10-CM

## 2015-03-17 DIAGNOSIS — O24414 Gestational diabetes mellitus in pregnancy, insulin controlled: Secondary | ICD-10-CM

## 2015-03-17 DIAGNOSIS — O3663X1 Maternal care for excessive fetal growth, third trimester, fetus 1: Secondary | ICD-10-CM

## 2015-03-17 DIAGNOSIS — Z36 Encounter for antenatal screening of mother: Secondary | ICD-10-CM | POA: Diagnosis not present

## 2015-03-18 ENCOUNTER — Encounter: Payer: Self-pay | Admitting: Obstetrics and Gynecology

## 2015-03-20 ENCOUNTER — Observation Stay (EMERGENCY_DEPARTMENT_HOSPITAL)
Admission: EM | Admit: 2015-03-20 | Discharge: 2015-03-20 | Disposition: A | Discharge status: Home / Self Care | Payer: 59 | Source: Home / Self Care | Admitting: Obstetrics and Gynecology

## 2015-03-20 DIAGNOSIS — Z3A33 33 weeks gestation of pregnancy: Secondary | ICD-10-CM | POA: Insufficient documentation

## 2015-03-20 DIAGNOSIS — O24419 Gestational diabetes mellitus in pregnancy, unspecified control: Secondary | ICD-10-CM

## 2015-03-20 DIAGNOSIS — O3660X0 Maternal care for excessive fetal growth, unspecified trimester, not applicable or unspecified: Secondary | ICD-10-CM | POA: Diagnosis not present

## 2015-03-20 LAB — GLUCOSE, CAPILLARY: GLUCOSE-CAPILLARY: 100 mg/dL — AB (ref 65–99)

## 2015-03-20 MED ORDER — TERBUTALINE SULFATE 1 MG/ML IJ SOLN
INTRAMUSCULAR | Status: AC
  Administered 2015-03-20: 0.25 mg
  Filled 2015-03-20: qty 1

## 2015-03-20 MED ORDER — TERBUTALINE SULFATE 1 MG/ML IJ SOLN: 0.2500 mg | Freq: Once | INTRAMUSCULAR | Status: DC

## 2015-03-20 NOTE — OB Triage Provider Note (Signed)
  March 20, 2015  Patient: Brittany SchirmerDarneisha L White  Date of Birth: 04-Jan-1986  Date of Visit: 03/20/2015    To Whom It May Concern:  Brittany HaroldDarneisha White was seen and treated in our labor and delivery department on 03/20/2015. She can not return to work until she is [redacted] weeks pregnant.  Sincerely, Dr. Youlanda Mightyefrancessco,

## 2015-03-20 NOTE — OB Triage Note (Signed)
Come back  If more contractions, SROM or DFM

## 2015-03-20 NOTE — Progress Notes (Signed)
L&D OB Triage Note  SUBJECTIVE Brittany White is a 29 y.o. G1P0 female at 5717w3d, EDD Estimated Date of Delivery: 05/05/15 who presented to triage with complaints of  1. Contractions 2. Leaking fluid 3. GDM A2.    OBJECTIVE MD Evaluation: Temp(Src) 98.6 F (37 C) (Oral)  Resp 18  LMP 07/29/2014 Findings significant for: Uterine irritability; Cervical dilation 4/90/-1/vertex/BOWI (nitrazine -). Exam done by me.  NST was performed and has been reviewed by me.  NST INTERPRETATION: Indications: gestational diabetes mellitus and rule out uterine contractions  Mode: External Baseline Rate (A): 158 bpm Variability: Moderate Accelerations: 15 x 15 Decelerations: None     Contraction Frequency (min): none   Initial strip showed uterine irritability; stopped with single dose of SQ terbutaline 0.25 mg Blood glucose 100  ASSESSMENT Impression:  1. Pregnancy:  G1P0 at 3917w3d , EDD Estimated Date of Delivery: 05/05/15; H/O betamethasone injection for lung maturation; no ROM 2.  Reactive NST 3. A2 GDM 4. Cervical dilation 4/90/-1  PLAN 1. PML Precautions 2. Discharge home with bleeding/labor precautions.  3. Bed rest with BRP; No further work until 36.0 weeks  3. Continue  prenatal care/HROB Care. ([facility] 2x/week A/P testing) 4.GBS culture, urine culture done.   Herold HarmsMartin A Defrancesco, MD

## 2015-03-21 ENCOUNTER — Observation Stay (HOSPITAL_BASED_OUTPATIENT_CLINIC_OR_DEPARTMENT_OTHER)
Admission: EM | Admit: 2015-03-21 | Discharge: 2015-03-21 | Disposition: A | Discharge status: Home / Self Care | Payer: 59 | Source: Home / Self Care | Attending: Obstetrics and Gynecology | Admitting: Obstetrics and Gynecology

## 2015-03-21 ENCOUNTER — Encounter: Payer: Self-pay | Admitting: *Deleted

## 2015-03-21 ENCOUNTER — Ambulatory Visit (HOSPITAL_BASED_OUTPATIENT_CLINIC_OR_DEPARTMENT_OTHER)
Admission: RE | Admit: 2015-03-21 | Discharge: 2015-03-21 | Disposition: A | Discharge status: Home / Self Care | Payer: 59 | Source: Ambulatory Visit | Attending: Maternal & Fetal Medicine | Admitting: Maternal & Fetal Medicine

## 2015-03-21 ENCOUNTER — Ambulatory Visit: Payer: Self-pay

## 2015-03-21 DIAGNOSIS — O3433 Maternal care for cervical incompetence, third trimester: Secondary | ICD-10-CM

## 2015-03-21 DIAGNOSIS — O3660X1 Maternal care for excessive fetal growth, unspecified trimester, fetus 1: Secondary | ICD-10-CM

## 2015-03-21 DIAGNOSIS — O24419 Gestational diabetes mellitus in pregnancy, unspecified control: Secondary | ICD-10-CM | POA: Diagnosis not present

## 2015-03-21 DIAGNOSIS — Z3A33 33 weeks gestation of pregnancy: Secondary | ICD-10-CM

## 2015-03-21 DIAGNOSIS — E669 Obesity, unspecified: Secondary | ICD-10-CM

## 2015-03-21 DIAGNOSIS — B951 Streptococcus, group B, as the cause of diseases classified elsewhere: Secondary | ICD-10-CM | POA: Diagnosis present

## 2015-03-21 DIAGNOSIS — O24414 Gestational diabetes mellitus in pregnancy, insulin controlled: Secondary | ICD-10-CM

## 2015-03-21 LAB — CULTURE, BETA STREP (GROUP B ONLY)

## 2015-03-21 LAB — GLUCOSE, CAPILLARY
GLUCOSE-CAPILLARY: 206 mg/dL — AB (ref 65–99)
Glucose-Capillary: 156 mg/dL — ABNORMAL HIGH (ref 65–99)

## 2015-03-21 MED ORDER — ACETAMINOPHEN 325 MG PO TABS: 650.0000 mg | ORAL_TABLET | ORAL | Status: DC | PRN

## 2015-03-21 MED ORDER — PRENATAL MULTIVITAMIN CH: 1.0000 | ORAL_TABLET | Freq: Every day | ORAL | Status: DC

## 2015-03-21 MED ORDER — SODIUM CHLORIDE 0.9 % IV SOLN
1.0000 g | INTRAVENOUS | Status: DC
  Administered 2015-03-21: 1 g via INTRAVENOUS
  Filled 2015-03-21: qty 1000

## 2015-03-21 MED ORDER — LIDOCAINE HCL (PF) 1 % IJ SOLN
INTRAMUSCULAR | Status: AC
  Filled 2015-03-21: qty 2

## 2015-03-21 MED ORDER — SODIUM CHLORIDE 0.9 % IV SOLN
2.0000 g | Freq: Once | INTRAVENOUS | Status: AC
  Administered 2015-03-21: 2 g via INTRAVENOUS
  Filled 2015-03-21: qty 2000

## 2015-03-21 MED ORDER — BETAMETHASONE SOD PHOS & ACET 6 (3-3) MG/ML IJ SUSP
12.0000 mg | INTRAMUSCULAR | Status: DC
  Administered 2015-03-21: 6 mg via INTRAMUSCULAR
  Filled 2015-03-21: qty 2

## 2015-03-21 MED ORDER — CALCIUM CARBONATE ANTACID 500 MG PO CHEW: 2.0000 | CHEWABLE_TABLET | ORAL | Status: DC | PRN

## 2015-03-21 MED ORDER — LACTATED RINGERS IV SOLN
INTRAVENOUS | Status: DC
  Administered 2015-03-21: 18:00:00 via INTRAVENOUS

## 2015-03-21 MED ORDER — TERBUTALINE SULFATE 1 MG/ML IJ SOLN
0.2500 mg | Freq: Once | INTRAMUSCULAR | Status: AC | PRN
  Administered 2015-03-21: 0.25 mg via SUBCUTANEOUS
  Filled 2015-03-21: qty 1

## 2015-03-21 MED ORDER — INSULIN ASPART 100 UNIT/ML ~~LOC~~ SOLN
26.0000 [IU] | Freq: Once | SUBCUTANEOUS | Status: AC
  Administered 2015-03-21: 20 [IU] via SUBCUTANEOUS
  Filled 2015-03-21: qty 1
  Filled 2015-03-21: qty 0.26
  Filled 2015-03-21: qty 26

## 2015-03-21 NOTE — OB Triage Note (Signed)
Discharge instructions reviewed with patient and spouse.  Pt. Verbalized understanding of all discharge instructions, copies signed, copy given. Pt. Instructed to returned to hospital if she experience increased temp., 100.4 or greater, gush of fluid that is not urine, call EMS if she starts to have active heavy vaginal bleeding. Pt. Instructed to return to hospital tomorrow at 1700 for 2nd dose of BMZ, NST, and cervical check.   Pt. Verbalized understanding.

## 2015-03-21 NOTE — Progress Notes (Signed)
Spoke to Dr. Greggory Keenefrancesco regarding insulin dose of Novolog, according to 206 mg/dl blood sugar, pt. Is to receive 20 Units of Novolog...according to pt.'s regiment with [facility].  Dr. Greggory Keenefrancesco in agreement.

## 2015-03-21 NOTE — Final Progress Note (Signed)
L&D OB Triage Note  SUBJECTIVE Brittany White is a 29 y.o. G1P0 female at 501w4d, EDD Estimated Date of Delivery: 05/05/15 who [facility]. Pt previously received IM betamethasone for lung maturation; Dr Fayrene FearingJames, MFM, recommends a booster dose of steroids today and tomorrow, and further monitoring with consideration for tocolytic therapy at least until steroid regimen is complete 24 hours from now, if cervix changes .    OBJECTIVE MD Evaluation: BP 128/71 mmHg  Pulse 89  Temp(Src) 98.4 F (36.9 C) (Oral)  Resp 18  LMP 07/29/2014  Findings significant for unchanged cervix from yesterday exam (4/90/0/vertex/BOWI). Uterus non tender GBS culture from yesterday returned POSITIVE  NST INTERPRETATION: Indications: 33.4 week IUP; A2 GDM; Advanced Cervical Dilation  Mode: External Baseline Rate (A): 150 bpm Variability: Moderate Accelerations: 15 x 15 Decelerations: None     Irregular 7-10 minutes, mild  ASSESSMENT Impression:  1. Pregnancy:  G1P0 at 151w4d , EDD Estimated Date of Delivery: 05/05/15 2. Reactive NST 3. A2 GDM 4. Cervical Dilation; uterine irritability 5. GBS POSITIVE  PLAN 1. Betamethasone 12 mg IM today; Repeat in 24 hours. 2. SQ Terbutaline 0.25 mg x 1 dose given to suppress irritability of uterus  3. IV LR with 2 doses of Ampicillin - 4 hours apart (2 gm, followed by 1 gm) 4. Novolog Insulin per pt protocol for BS control (20 units given) 5. Return in 24 hours for 2nd betamethasone injection, NST, and Cervix check.   Herold HarmsMartin A Sadiel Mota, MD

## 2015-03-21 NOTE — OB Triage Note (Signed)
[facility] at 7628w4d.  Changed to gown and to bed.  Dr Greggory Keenefrancesco present to discuss plan of care.  Oriented to room.  Consents signed.

## 2015-03-21 NOTE — Progress Notes (Signed)
Here for NST.    FHR baseline is 145 bpm.  There are accelerations, and one variable deceleration to 105 for 20 seconds.  The patient is contracting every 4-6 minutes.  Impression:  Reactive NST.  Variable decel.  Plan: I spoke to Dr. Greggory KeeneFrancesco.  The patient was sent to L & D for further evaluation and management.  Argentina PonderAndra H. Jesten Cappuccio, MD [facility]

## 2015-03-22 ENCOUNTER — Inpatient Hospital Stay
Admission: EM | Admit: 2015-03-22 | Discharge: 2015-03-22 | Disposition: A | Discharge status: Home / Self Care | Payer: 59 | Source: Other Acute Inpatient Hospital | Attending: Obstetrics and Gynecology | Admitting: Obstetrics and Gynecology

## 2015-03-22 DIAGNOSIS — O26893 Other specified pregnancy related conditions, third trimester: Secondary | ICD-10-CM | POA: Insufficient documentation

## 2015-03-22 DIAGNOSIS — Z3A33 33 weeks gestation of pregnancy: Secondary | ICD-10-CM

## 2015-03-22 LAB — URINE CULTURE: SPECIAL REQUESTS: NORMAL

## 2015-03-22 MED ORDER — BETAMETHASONE SOD PHOS & ACET 6 (3-3) MG/ML IJ SUSP
INTRAMUSCULAR | Status: AC
  Administered 2015-03-22: 12 mg via INTRAMUSCULAR
  Filled 2015-03-22: qty 1

## 2015-03-22 MED ORDER — BETAMETHASONE SOD PHOS & ACET 6 (3-3) MG/ML IJ SUSP
12.0000 mg | Freq: Once | INTRAMUSCULAR | Status: AC
  Administered 2015-03-22: 12 mg via INTRAMUSCULAR

## 2015-03-22 NOTE — Discharge Summary (Signed)
Discharged instructions reviewed with patient and significant other including follow up appointments, activity restrictions, signs of preterm labor and when to seek medical attention. All questions answered. Patient discharged home via wheelchair in stable condition.

## 2015-03-23 ENCOUNTER — Encounter
Admission: EM | Disposition: A | Discharge status: Home / Self Care | Payer: Self-pay | Source: Home / Self Care | Attending: Obstetrics and Gynecology

## 2015-03-23 ENCOUNTER — Inpatient Hospital Stay: Payer: 59 | Admitting: Anesthesiology

## 2015-03-23 ENCOUNTER — Inpatient Hospital Stay
Admission: EM | Admit: 2015-03-23 | Discharge: 2015-03-27 | DRG: 765 | Disposition: A | Discharge status: Home / Self Care | Payer: 59 | Attending: Obstetrics and Gynecology | Admitting: Obstetrics and Gynecology

## 2015-03-23 ENCOUNTER — Encounter: Payer: Self-pay | Admitting: Obstetrics and Gynecology

## 2015-03-23 ENCOUNTER — Encounter: Payer: Self-pay | Admitting: *Deleted

## 2015-03-23 ENCOUNTER — Encounter: Payer: 59 | Admitting: Obstetrics and Gynecology

## 2015-03-23 DIAGNOSIS — O99824 Streptococcus B carrier state complicating childbirth: Secondary | ICD-10-CM | POA: Diagnosis present

## 2015-03-23 DIAGNOSIS — O3663X0 Maternal care for excessive fetal growth, third trimester, not applicable or unspecified: Secondary | ICD-10-CM | POA: Diagnosis present

## 2015-03-23 DIAGNOSIS — Z3403 Encounter for supervision of normal first pregnancy, third trimester: Secondary | ICD-10-CM | POA: Diagnosis not present

## 2015-03-23 DIAGNOSIS — O24424 Gestational diabetes mellitus in childbirth, insulin controlled: Secondary | ICD-10-CM | POA: Diagnosis present

## 2015-03-23 DIAGNOSIS — Z3A33 33 weeks gestation of pregnancy: Secondary | ICD-10-CM

## 2015-03-23 DIAGNOSIS — Z3490 Encounter for supervision of normal pregnancy, unspecified, unspecified trimester: Secondary | ICD-10-CM

## 2015-03-23 LAB — GLUCOSE, CAPILLARY
Glucose-Capillary: 169 mg/dL — ABNORMAL HIGH (ref 65–99)
Glucose-Capillary: 150 mg/dL — ABNORMAL HIGH (ref 65–99)

## 2015-03-23 LAB — TYPE AND SCREEN
ABO/RH(D): O POS
Antibody Screen: NEGATIVE

## 2015-03-23 LAB — CBC WITH DIFFERENTIAL/PLATELET
BASOS PCT: 0 %
Basophils Absolute: 0 10*3/uL (ref 0–0.1)
EOS PCT: 0 %
Eosinophils Absolute: 0 10*3/uL (ref 0–0.7)
HCT: 36.7 % (ref 35.0–47.0)
Hemoglobin: 11.5 g/dL — ABNORMAL LOW (ref 12.0–16.0)
LYMPHS ABS: 2.3 10*3/uL (ref 1.0–3.6)
Lymphocytes Relative: 12 %
MCH: 23.7 pg — AB (ref 26.0–34.0)
MCHC: 31.3 g/dL — AB (ref 32.0–36.0)
MCV: 75.7 fL — ABNORMAL LOW (ref 80.0–100.0)
MONO ABS: 2.3 10*3/uL — AB (ref 0.2–0.9)
Monocytes Relative: 12 %
Neutro Abs: 14.5 10*3/uL — ABNORMAL HIGH (ref 1.4–6.5)
Neutrophils Relative %: 76 %
PLATELETS: 253 10*3/uL (ref 150–440)
RBC: 4.85 MIL/uL (ref 3.80–5.20)
RDW: 14.4 % (ref 11.5–14.5)
WBC: 19.1 10*3/uL — AB (ref 3.6–11.0)

## 2015-03-23 LAB — RAPID HIV SCREEN (HIV 1/2 AB+AG)
HIV 1/2 Antibodies: NONREACTIVE
HIV-1 P24 ANTIGEN - HIV24: NONREACTIVE

## 2015-03-23 SURGERY — Surgical Case
Anesthesia: Spinal

## 2015-03-23 SURGERY — Surgical Case
Anesthesia: Regional | Site: Abdomen | Wound class: Clean Contaminated

## 2015-03-23 MED ORDER — DIPHENHYDRAMINE HCL 50 MG/ML IJ SOLN
12.5000 mg | INTRAMUSCULAR | Status: DC | PRN
Start: 2015-03-23 — End: 2015-03-27

## 2015-03-23 MED ORDER — IBUPROFEN 600 MG PO TABS
600.0000 mg | ORAL_TABLET | Freq: Four times a day (QID) | ORAL | Status: DC | PRN
  Filled 2015-03-23 (×2): qty 1

## 2015-03-23 MED ORDER — WITCH HAZEL-GLYCERIN EX PADS: 1.0000 "application " | MEDICATED_PAD | CUTANEOUS | Status: DC | PRN

## 2015-03-23 MED ORDER — SIMETHICONE 80 MG PO CHEW
80.0000 mg | CHEWABLE_TABLET | ORAL | Status: DC | PRN
  Filled 2015-03-23: qty 1

## 2015-03-23 MED ORDER — NALBUPHINE HCL 10 MG/ML IJ SOLN
5.0000 mg | Freq: Once | INTRAMUSCULAR | Status: DC | PRN
  Filled 2015-03-23: qty 0.5

## 2015-03-23 MED ORDER — OXYTOCIN 40 UNITS IN LACTATED RINGERS INFUSION - SIMPLE MED: 62.5000 mL/h | INTRAVENOUS | Status: DC

## 2015-03-23 MED ORDER — CEFAZOLIN SODIUM-DEXTROSE 2-3 GM-% IV SOLR
INTRAVENOUS | Status: AC
  Administered 2015-03-23: 2 g via INTRAVENOUS
  Filled 2015-03-23: qty 50

## 2015-03-23 MED ORDER — SCOPOLAMINE 1 MG/3DAYS TD PT72
1.0000 | MEDICATED_PATCH | Freq: Once | TRANSDERMAL | Status: DC
  Filled 2015-03-23 (×2): qty 1

## 2015-03-23 MED ORDER — SODIUM CHLORIDE 0.9 % IV SOLN: 1.0000 g | INTRAVENOUS | Status: DC

## 2015-03-23 MED ORDER — SIMETHICONE 80 MG PO CHEW
80.0000 mg | CHEWABLE_TABLET | ORAL | Status: DC
  Administered 2015-03-24 – 2015-03-26 (×3): 80 mg via ORAL
  Filled 2015-03-23: qty 1

## 2015-03-23 MED ORDER — MORPHINE SULFATE (PF) 0.5 MG/ML IJ SOLN
INTRAMUSCULAR | Status: DC | PRN
  Administered 2015-03-23: .2 mg via INTRATHECAL

## 2015-03-23 MED ORDER — ONDANSETRON HCL 4 MG/2ML IJ SOLN: 4.0000 mg | Freq: Four times a day (QID) | INTRAMUSCULAR | Status: DC | PRN

## 2015-03-23 MED ORDER — FENTANYL CITRATE (PF) 100 MCG/2ML IJ SOLN
50.0000 ug | INTRAMUSCULAR | Status: DC | PRN
  Administered 2015-03-23: 20 ug via INTRAVENOUS

## 2015-03-23 MED ORDER — LACTATED RINGERS IV SOLN: INTRAVENOUS | Status: DC

## 2015-03-23 MED ORDER — FENTANYL CITRATE (PF) 100 MCG/2ML IJ SOLN: 25.0000 ug | INTRAMUSCULAR | Status: DC | PRN

## 2015-03-23 MED ORDER — NALBUPHINE HCL 10 MG/ML IJ SOLN
5.0000 mg | INTRAMUSCULAR | Status: DC | PRN
  Filled 2015-03-23: qty 0.5

## 2015-03-23 MED ORDER — MENTHOL 3 MG MT LOZG: 1.0000 | LOZENGE | OROMUCOSAL | Status: DC | PRN

## 2015-03-23 MED ORDER — LIDOCAINE 5 % EX PTCH
MEDICATED_PATCH | CUTANEOUS | Status: DC | PRN
  Administered 2015-03-23: 1 via TRANSDERMAL

## 2015-03-23 MED ORDER — INSULIN REGULAR HUMAN 100 UNIT/ML IJ SOLN
INTRAMUSCULAR | Status: DC
  Filled 2015-03-23: qty 2.5

## 2015-03-23 MED ORDER — CITRIC ACID-SODIUM CITRATE 334-500 MG/5ML PO SOLN: 30.0000 mL | ORAL | Status: DC | PRN

## 2015-03-23 MED ORDER — OXYTOCIN BOLUS FROM INFUSION: 500.0000 mL | INTRAVENOUS | Status: DC

## 2015-03-23 MED ORDER — ONDANSETRON HCL 4 MG/2ML IJ SOLN: 4.0000 mg | Freq: Three times a day (TID) | INTRAMUSCULAR | Status: DC | PRN

## 2015-03-23 MED ORDER — NALOXONE HCL 0.4 MG/ML IJ SOLN: 0.4000 mg | INTRAMUSCULAR | Status: DC | PRN

## 2015-03-23 MED ORDER — DEXTROSE 50 % IV SOLN: 25.0000 mL | INTRAVENOUS | Status: DC | PRN

## 2015-03-23 MED ORDER — BUPIVACAINE HCL (PF) 0.75 % IJ SOLN
INTRAMUSCULAR | Status: DC | PRN
  Administered 2015-03-23: 1.6 mL

## 2015-03-23 MED ORDER — LACTATED RINGERS IV SOLN: 500.0000 mL | INTRAVENOUS | Status: DC | PRN

## 2015-03-23 MED ORDER — OXYTOCIN 40 UNITS IN LACTATED RINGERS INFUSION - SIMPLE MED: 62.5000 mL/h | INTRAVENOUS | Status: AC

## 2015-03-23 MED ORDER — LANOLIN HYDROUS EX OINT: 1.0000 "application " | TOPICAL_OINTMENT | CUTANEOUS | Status: DC | PRN

## 2015-03-23 MED ORDER — OXYCODONE-ACETAMINOPHEN 5-325 MG PO TABS
2.0000 | ORAL_TABLET | ORAL | Status: DC | PRN
  Administered 2015-03-25 – 2015-03-27 (×4): 2 via ORAL
  Filled 2015-03-23 (×4): qty 2

## 2015-03-23 MED ORDER — OXYTOCIN 40 UNITS IN LACTATED RINGERS INFUSION - SIMPLE MED
INTRAVENOUS | Status: AC
  Filled 2015-03-23: qty 1000

## 2015-03-23 MED ORDER — OXYCODONE-ACETAMINOPHEN 5-325 MG PO TABS
1.0000 | ORAL_TABLET | ORAL | Status: DC | PRN
  Administered 2015-03-25 – 2015-03-27 (×5): 1 via ORAL
  Filled 2015-03-23 (×7): qty 1

## 2015-03-23 MED ORDER — FENTANYL CITRATE (PF) 100 MCG/2ML IJ SOLN: 50.0000 ug | INTRAMUSCULAR | Status: DC | PRN

## 2015-03-23 MED ORDER — SODIUM CHLORIDE 0.9 % IV SOLN
INTRAVENOUS | Status: AC
  Filled 2015-03-23: qty 2000

## 2015-03-23 MED ORDER — MEASLES, MUMPS & RUBELLA VAC ~~LOC~~ INJ
0.5000 mL | INJECTION | Freq: Once | SUBCUTANEOUS | Status: AC
  Administered 2015-03-24: 0.5 mL via SUBCUTANEOUS
  Filled 2015-03-23 (×2): qty 0.5

## 2015-03-23 MED ORDER — SODIUM CHLORIDE 0.45 % IV SOLN: INTRAVENOUS | Status: DC

## 2015-03-23 MED ORDER — LIDOCAINE HCL (PF) 1 % IJ SOLN: 30.0000 mL | INTRAMUSCULAR | Status: DC | PRN

## 2015-03-23 MED ORDER — CEFAZOLIN SODIUM-DEXTROSE 2-3 GM-% IV SOLR: 2.0000 g | INTRAVENOUS | Status: DC

## 2015-03-23 MED ORDER — ONDANSETRON HCL 4 MG/2ML IJ SOLN: 4.0000 mg | Freq: Once | INTRAMUSCULAR | Status: DC | PRN

## 2015-03-23 MED ORDER — LIDOCAINE 5 % EX PTCH
MEDICATED_PATCH | CUTANEOUS | Status: AC
  Filled 2015-03-23: qty 1

## 2015-03-23 MED ORDER — SENNOSIDES-DOCUSATE SODIUM 8.6-50 MG PO TABS
2.0000 | ORAL_TABLET | ORAL | Status: DC
  Administered 2015-03-24 – 2015-03-26 (×3): 2 via ORAL
  Filled 2015-03-23 (×3): qty 2

## 2015-03-23 MED ORDER — AMPICILLIN SODIUM 1 G IJ SOLR: 1.0000 g | INTRAMUSCULAR | Status: DC

## 2015-03-23 MED ORDER — CITRIC ACID-SODIUM CITRATE 334-500 MG/5ML PO SOLN: 30.0000 mL | ORAL | Status: DC

## 2015-03-23 MED ORDER — INSULIN REGULAR BOLUS VIA INFUSION
0.0000 [IU] | Freq: Three times a day (TID) | INTRAVENOUS | Status: DC
  Filled 2015-03-23: qty 10

## 2015-03-23 MED ORDER — SIMETHICONE 80 MG PO CHEW
80.0000 mg | CHEWABLE_TABLET | Freq: Three times a day (TID) | ORAL | Status: DC
  Administered 2015-03-24 – 2015-03-27 (×9): 80 mg via ORAL
  Filled 2015-03-23 (×10): qty 1

## 2015-03-23 MED ORDER — ACETAMINOPHEN 325 MG PO TABS: 650.0000 mg | ORAL_TABLET | ORAL | Status: DC | PRN

## 2015-03-23 MED ORDER — LACTATED RINGERS IV SOLN
500.0000 mL | INTRAVENOUS | Status: DC | PRN
  Administered 2015-03-23: 500 mL via INTRAVENOUS

## 2015-03-23 MED ORDER — SODIUM CHLORIDE 0.9 % IJ SOLN: 3.0000 mL | INTRAMUSCULAR | Status: DC | PRN

## 2015-03-23 MED ORDER — DIPHENHYDRAMINE HCL 25 MG PO CAPS: 25.0000 mg | ORAL_CAPSULE | Freq: Four times a day (QID) | ORAL | Status: DC | PRN

## 2015-03-23 MED ORDER — DEXTROSE-NACL 5-0.45 % IV SOLN: INTRAVENOUS | Status: DC

## 2015-03-23 MED ORDER — KETOROLAC TROMETHAMINE 30 MG/ML IJ SOLN: 30.0000 mg | Freq: Four times a day (QID) | INTRAMUSCULAR | Status: AC | PRN

## 2015-03-23 MED ORDER — TETANUS-DIPHTH-ACELL PERTUSSIS 5-2.5-18.5 LF-MCG/0.5 IM SUSP: 0.5000 mL | Freq: Once | INTRAMUSCULAR | Status: DC

## 2015-03-23 MED ORDER — DIPHENHYDRAMINE HCL 25 MG PO CAPS
25.0000 mg | ORAL_CAPSULE | ORAL | Status: DC | PRN
  Administered 2015-03-24 (×2): 25 mg via ORAL
  Filled 2015-03-23 (×2): qty 1

## 2015-03-23 MED ORDER — MEPERIDINE HCL 25 MG/ML IJ SOLN: 6.2500 mg | INTRAMUSCULAR | Status: DC | PRN

## 2015-03-23 MED ORDER — LACTATED RINGERS IV SOLN
INTRAVENOUS | Status: DC
  Administered 2015-03-23 (×2): via INTRAVENOUS

## 2015-03-23 MED ORDER — NALOXONE HCL 2 MG/2ML IJ SOSY
1.0000 ug/kg/h | PREFILLED_SYRINGE | INTRAVENOUS | Status: DC | PRN
  Filled 2015-03-23: qty 2

## 2015-03-23 MED ORDER — CITRIC ACID-SODIUM CITRATE 334-500 MG/5ML PO SOLN
ORAL | Status: AC
  Filled 2015-03-23: qty 15

## 2015-03-23 MED ORDER — MORPHINE SULFATE (PF) 2 MG/ML IV SOLN: 2.0000 mg | INTRAVENOUS | Status: DC | PRN

## 2015-03-23 MED ORDER — IBUPROFEN 800 MG PO TABS
800.0000 mg | ORAL_TABLET | Freq: Three times a day (TID) | ORAL | Status: DC
  Administered 2015-03-24 – 2015-03-27 (×10): 800 mg via ORAL
  Filled 2015-03-23 (×10): qty 1

## 2015-03-23 MED ORDER — DIBUCAINE 1 % RE OINT: 1.0000 "application " | TOPICAL_OINTMENT | RECTAL | Status: DC | PRN

## 2015-03-23 MED ORDER — BREAST PUMP MISC: 1.0000 | Status: DC | PRN

## 2015-03-23 MED ORDER — PRENATAL MULTIVITAMIN CH
1.0000 | ORAL_TABLET | Freq: Every day | ORAL | Status: DC
  Administered 2015-03-24 – 2015-03-27 (×4): 1 via ORAL
  Filled 2015-03-23 (×4): qty 1

## 2015-03-23 MED ORDER — EPHEDRINE SULFATE 50 MG/ML IJ SOLN
INTRAMUSCULAR | Status: DC | PRN
  Administered 2015-03-23: 10 mg via INTRAVENOUS

## 2015-03-23 MED ORDER — KETOROLAC TROMETHAMINE 30 MG/ML IJ SOLN
30.0000 mg | Freq: Four times a day (QID) | INTRAMUSCULAR | Status: AC | PRN
  Administered 2015-03-23: 30 mg via INTRAVENOUS
  Filled 2015-03-23: qty 1

## 2015-03-23 MED ORDER — OXYTOCIN 40 UNITS IN LACTATED RINGERS INFUSION - SIMPLE MED
62.5000 mL/h | INTRAVENOUS | Status: DC
  Administered 2015-03-23: 200 mL via INTRAVENOUS

## 2015-03-23 MED ORDER — FERROUS SULFATE 325 (65 FE) MG PO TABS
325.0000 mg | ORAL_TABLET | Freq: Two times a day (BID) | ORAL | Status: DC
  Administered 2015-03-24 – 2015-03-27 (×7): 325 mg via ORAL
  Filled 2015-03-23 (×7): qty 1

## 2015-03-23 SURGICAL SUPPLY — 23 items
CANISTER SUCT 3000ML (MISCELLANEOUS) ×3 IMPLANT
CHLORAPREP W/TINT 26ML (MISCELLANEOUS) ×3 IMPLANT
CLEANER CAUTERY TIP 5X5 PAD (MISCELLANEOUS) IMPLANT
DRSG TELFA 3X8 NADH (GAUZE/BANDAGES/DRESSINGS) ×3 IMPLANT
GAUZE SPONGE 4X4 12PLY STRL (GAUZE/BANDAGES/DRESSINGS) ×3 IMPLANT
GLOVE BIO SURGEON STRL SZ8 (GLOVE) ×3 IMPLANT
GOWN STRL REUS W/ TWL LRG LVL3 (GOWN DISPOSABLE) ×2 IMPLANT
GOWN STRL REUS W/ TWL XL LVL3 (GOWN DISPOSABLE) ×1 IMPLANT
GOWN STRL REUS W/TWL LRG LVL3 (GOWN DISPOSABLE) ×6
GOWN STRL REUS W/TWL XL LVL3 (GOWN DISPOSABLE) ×3
NS IRRIG 1000ML POUR BTL (IV SOLUTION) ×3 IMPLANT
PACK C SECTION AR (MISCELLANEOUS) ×3 IMPLANT
PAD CLEANER CAUTERY TIP 5X5 (MISCELLANEOUS) ×2
PAD DRESSING TELFA 3X8 NADH (GAUZE/BANDAGES/DRESSINGS) ×1 IMPLANT
PAD GROUND ADULT SPLIT (MISCELLANEOUS) ×3 IMPLANT
PAD OB MATERNITY 4.3X12.25 (PERSONAL CARE ITEMS) ×3 IMPLANT
PAD PREP 24X41 OB/GYN DISP (PERSONAL CARE ITEMS) ×3 IMPLANT
STRAP SAFETY BODY (MISCELLANEOUS) ×3 IMPLANT
SUT CHROMIC 1-0 (SUTURE) ×9 IMPLANT
SUT MAXON ABS #0 GS21 30IN (SUTURE) ×6 IMPLANT
SUT PLAIN GUT 0 (SUTURE) ×6 IMPLANT
SUT VIC AB 2-0 CT1 27 (SUTURE) ×6
SUT VIC AB 2-0 CT1 TAPERPNT 27 (SUTURE) ×2 IMPLANT

## 2015-03-23 NOTE — Progress Notes (Signed)
Review of patient's latest Insulin dosing schedule [facility] ( Dr Fayrene FearingJames):  Lantus 36 units q AM Lantus 36 units q PM  Humalog 26 units AM Humalog 24 Units Lunch Humalog 16 units PM  I have consulted the Diabetes coordinator and Hospitalist to help manage Post Op/Post Partum blood sugars.  Herold HarmsMartin A Lilo Wallington, MD

## 2015-03-23 NOTE — OB Triage Note (Signed)
3415w6d G1 Pt c/o decreased FM

## 2015-03-23 NOTE — H&P (Signed)
Obstetric Preoperative History and Physical  Brittany White is a 29 y.o. G1P0 with IUP at [redacted]w[redacted]d presents with decreased FM and Non reassuring FHR Tracing. GDM A2; Blood Glucose 167 Received 2 courses of Betamethasone; 2nd dose of round 2 yesterday. GBS POSITIVE; received 2 doses of IV Ampicillin yesterday in L&D Source of Care: [facility]  Pregnancy complications or risks: Patient Active Problem List   Diagnosis Date Noted  . Pregnancy 03/23/2015  . Labor and delivery indication for care or intervention 03/21/2015  . Positive GBS test 03/21/2015  . Preterm labor 03/20/2015  . Fetal macrosomia during pregnancy 03/14/2015  . Cervical funneling affecting pregnancy in third trimester, antepartum 02/23/2015  . Gestational diabetes requiring insulin 12/21/2014  . Chronic hypertension complicating or reason for care during pregnancy 10/14/2014  . Obesity (BMI 30.0-34.9) 09/25/2013  . Tension headache 02/03/2013    Prenatal labs and studies: ABO, Rh: O/Positive/-- (06/17 1413) Antibody: Negative (06/17 1413) Rubella: 1.83 (06/17 1413) RPR: Non Reactive (06/17 1413)  HBsAg: Negative (06/17 1413)  HIV: Non Reactive (06/17 1413)  GBS:  POSITIVE 1 hr Glucola  Abnormal Genetic screening normal Anatomy US normal  Prenatal Transfer Tool  Maternal Diabetes: Yes:  Diabetes Type:  Insulin/Medication controlled Genetic Screening: Normal Maternal Ultrasounds/Referrals: Normal Fetal Ultrasounds or other Referrals:  None Maternal Substance Abuse:  No Significant Maternal Medications:  Meds include: Other:  INSULIN Significant Maternal Lab Results: Blood Sugars 100-217 (02/07/2015-03/21/2015)  Past Medical History  Diagnosis Date  . Anxiety   . Hypertension   . Chronic hypertension complicating or reason for care during pregnancy 10/14/2014    controlled by lifestyle modification, on no meds  . Headache     Past Surgical History  Procedure Laterality Date  . No past  surgeries      OB History  Gravida Para Term Preterm AB SAB TAB Ectopic Multiple Living  1             # Outcome Date GA Lbr Len/2nd Weight Sex Delivery Anes PTL Lv  1 Current               Social History   Social History  . Marital Status: Single    Spouse Name: N/A  . Number of Children: N/A  . Years of Education: N/A   Social History Main Topics  . Smoking status: Never Smoker   . Smokeless tobacco: Never Used  . Alcohol Use: No  . Drug Use: No  . Sexual Activity: Yes    Birth Control/ Protection: None     Comment: Pregnant   Other Topics Concern  . Not on file   Social History Narrative    Family History  Problem Relation Age of Onset  . Heart disease Maternal Aunt   . Heart disease Maternal Grandmother   . Diabetes Maternal Grandmother   . Hypertension Paternal Grandmother   . Heart disease Paternal Grandmother   . Diabetes Paternal Grandmother   . Hypertension Paternal Grandfather   . Cancer Paternal Grandfather     PROSTATE  . Gestational diabetes Sister   . Diabetes Maternal Grandfather     Prescriptions prior to admission  Medication Sig Dispense Refill Last Dose  . acetaminophen (TYLENOL) 500 MG tablet Take 1,000 mg by mouth every 6 (six) hours as needed.   Taking  . aspirin 81 MG chewable tablet Chew by mouth.   Taking  . glucose blood (ONE TOUCH ULTRA TEST) test strip 1 each by Other route as directed. Use  as instructed 200 each 6 Taking  . glucose blood (ONE TOUCH ULTRA TEST) test strip Use 4 (four) times daily. Use as instructed.   Taking  . insulin aspart (NOVOLOG FLEXPEN) 100 UNIT/ML FlexPen Inject 24 Units into the skin 3 (three) times daily with meals. 15 mL 11 Taking  . insulin glargine (LANTUS) 100 unit/mL SOPN 34 Units twice daily subcutaneously 15 mL 11 Taking  . Insulin Pen Needle (BD PEN NEEDLE NANO U/F) 32G X 4 MM MISC    Taking  . Lancet Devices (ONE TOUCH DELICA LANCING DEV) MISC 1 applicator by Does not apply route 4 (four) times  daily. 1 each 6 Taking  . Misc. Devices (BREAST PUMP) MISC 1 each by Does not apply route as needed. 1 each 0   . phenylephrine (SUDAFED PE) 10 MG TABS tablet Take 10 mg by mouth every 4 (four) hours as needed. Reported on 03/21/2015   Not Taking  . Prenatal Vit-Fe Fumarate-FA (MULTIVITAMIN-PRENATAL) 27-0.8 MG TABS tablet Take 1 tablet by mouth daily at 12 noon.   Taking    No Known Allergies  Review of Systems: Negative except for what is mentioned in HPI.  Physical Exam: LMP 07/29/2014 FHR by Doppler: 175 bpm GENERAL: Well-developed, well-nourished female in no acute distress.  LUNGS: Clear to auscultation bilaterally.  HEART: Regular rate and rhythm. ABDOMEN: Soft, nontender, nondistended, gravid PELVIC: 5/90/-2/Vertex/BOWI EXTREMITIES: Nontender, no edema, 2+ distal pulses.   Pertinent Labs/Studies:   Results for orders placed or performed during the hospital encounter of 03/21/15 (from the past 72 hour(s))  Glucose, capillary     Status: Abnormal   Collection Time: 03/21/15  4:57 PM  Result Value Ref Range   Glucose-Capillary 156 (H) 65 - 99 mg/dL  Glucose, capillary     Status: Abnormal   Collection Time: 03/21/15  8:17 PM  Result Value Ref Range   Glucose-Capillary 206 (H) 65 - 99 mg/dL    Assessment and Plan :Brittany White is a 29 y.o. G1P0 at 2635w6d being admitted  for scheduled cesarean section delivery . The patient is understanding of the planned procedure and is aware of and accepting of all surgical risks, including but not limited to: bleeding which may require transfusion or reoperation; infection which may require antibiotics; injury to bowel, bladder, ureters or other surrounding organs which may require repair; injury to the fetus; need for additional procedures including hysterectomy in the event of life-threatening complications; placental abnormalities wth subsequent pregnancies; incisional problems; blood clot disorders which may require blood thinners;, and  other postoperative/anesthesia complications. The patient is in agreement with the proposed plan, and gives informed written consent for the procedure. All questions have been answered.  Lamiyah Schlotter A. Beatris Sie [name], MD, [facility]

## 2015-03-23 NOTE — Anesthesia Preprocedure Evaluation (Signed)
Anesthesia Evaluation  Patient identified by MRN, date of birth, ID band Patient awake    Reviewed: Allergy & Precautions, H&P , NPO status , Patient's Chart, lab work & pertinent test results, reviewed documented beta blocker date and time   Airway Mallampati: III  TM Distance: >3 FB Neck ROM: full    Dental no notable dental hx. (+) Teeth Intact   Pulmonary neg pulmonary ROS,    Pulmonary exam normal breath sounds clear to auscultation       Cardiovascular Exercise Tolerance: Good hypertension, negative cardio ROS Normal cardiovascular exam Rhythm:regular Rate:Normal     Neuro/Psych  Headaches, negative neurological ROS  negative psych ROS   GI/Hepatic negative GI ROS, Neg liver ROS,   Endo/Other  negative endocrine ROSdiabetes  Renal/GU negative Renal ROS  negative genitourinary   Musculoskeletal   Abdominal   Peds  Hematology negative hematology ROS (+)   Anesthesia Other Findings   Reproductive/Obstetrics (+) Pregnancy                             Anesthesia Physical Anesthesia Plan  ASA: II  Anesthesia Plan: Regional and Spinal   Post-op Pain Management:    Induction:   Airway Management Planned:   Additional Equipment:   Intra-op Plan:   Post-operative Plan:   Informed Consent: I have reviewed the patients History and Physical, chart, labs and discussed the procedure including the risks, benefits and alternatives for the proposed anesthesia with the patient or authorized representative who has indicated his/her understanding and acceptance.     Plan Discussed with: CRNA  Anesthesia Plan Comments:         Anesthesia Quick Evaluation

## 2015-03-23 NOTE — Op Note (Signed)
Brittany White: 03/23/2015  PREOPERATIVE DIAGNOSES: Intrauterine pregnancy at 6849w6d weeks gestation; non-reassuring fetal status  POSTOPERATIVE DIAGNOSES: The same and Viable Female 6#14  PROCEDURE: Primary Low Transverse Cesarean Section  ANESTHESIA:: Spinal ANESTHESIOLOGIST: Dr. Pernell DupreAdams  SURGEON:  Dr. Daphine DeutscherMartin A. Mariam Helbert ASSISTANT:: OR Tech    INDICATIONS: Brittany White here for cesarean section delivery due to the indications listed under preoperative diagnoses; please see preoperative note for further details.  The risks of cesarean section were discussed with the patient including but not limited to: bleeding which may require transfusion or reoperation; infection which may require antibiotics; injury to bowel, bladder, ureters or other surrounding organs; injury to the fetus; need for additional procedures including hysterectomy in the event of a life-threatening hemorrhage; placental abnormalities wth subsequent pregnancies, incisional problems, thromboembolic phenomenon and other postoperative/anesthesia complications. The patient concurred with the proposed plan, giving informed written consent for the procedure. All questions were addressed.  FINDINGS:  Viable female infant in cephalic presentation.  Apgars 8 and 9.  Clear amniotic fluid.  Intact placenta, three vessel cord.  Normal uterus, fallopian tubes and ovaries bilaterally.  I/O's: Total I/O In: 700 [I.V.:700] Out: 950 [Urine:200; Blood:750] SPECIMENS:: Placenta and Arterial Cord Gas ANTIBIOTIC PROPHYLAXIS: Ancef 2 grams COUNTS:  YES COMPLICATIONS: None immediate  PROCEDURE IN DETAIL: Patient was brought to the operating room where she was placed in the sitting position. Spinal anesthetic was introduced without difficulty. She was placed in the supine position with a right lateral hip roll in place. Foley catheter was inserted and was draining clear yellow urine. The  abdomen was prepped with ChloraPrep solution and draped in a sterile manner. After timeout and checking for adequate level of anesthesia the procedure was started. Pfannenstiel incision was made into the abdomen. The fascia was incised transversely and extended bilaterally with Mayo scissors.  The midline raphae was identified and separated and the peritoneum was entered. Bladder flap was created over lower uterine segment with sharp dissection. A low transverse incision was made in the uterus and this was extended cephalad and caudad in standard fashion. Amniotic fluid was clear. The infant was delivered through vertex presentation and was noted to be active at birth. The umbilical cord was doubly clamped and cut and the infant was handed off to the resuscitating team. Cord blood sampling was obtained. Placenta was expressed from the uterus. Uterus was externalized onto the anterior abdominal wall and was cleared of all debris with laps. The uterine incision was closed in 1layer using #1 chromic suture.The first layer was closed in a running locking manner. Peri Jefferson. Good hemostasis was noted. The uterus was then placed back into the abdominal pelvic cavity. Gutters were cleared of all debris with laps. The incision was then closed in layers. 0 Maxon was used on the fascia in a simple running manner. Subcutaneous tissues were re approximated with 0 plain suture. The skin was closed with a subcuticular stitch of 4-0 Monocryl. Steri-Strips were placed. A Lidoderm patch was applied for analgesia. The patient was then mobilized and taken to the recovery room in satisfactory condition.      Brittany Arnette A. Beatris Sie [name], MD, [facility]

## 2015-03-23 NOTE — Anesthesia Procedure Notes (Signed)
Spinal Patient location during procedure: OB Start time: 03/23/2015 7:25 PM Staffing Performed by: anesthesiologist  Spinal Block Patient position: sitting Prep: Betadine Patient monitoring: heart rate, cardiac monitor, continuous pulse ox and blood pressure Approach: midline Location: L4-5 Injection technique: single-shot Needle Needle type: Tuohy  Needle gauge: 24 G Needle length: 10 cm Assessment Sensory level: T4 Additional Notes Tolerated well with vsst +csf 4 Q No paresthesia.  JA

## 2015-03-23 NOTE — Transfer of Care (Signed)
Immediate Anesthesia Transfer of Care Note  Patient: Brittany White  Procedure(s) Performed: Procedure(s): CESAREAN SECTION (N/A)  Patient Location: PACU  Anesthesia Type:Spinal  Level of Consciousness: awake, alert , oriented and patient cooperative  Airway & Oxygen Therapy: Patient Spontanous Breathing and Patient connected to nasal cannula oxygen  Post-op Assessment: Report given to RN and Post -op Vital signs reviewed and stable  Post vital signs: Reviewed and stable  Last Vitals:  Filed Vitals:   03/23/15 1724  BP: 139/73  Pulse: 79  Temp: 37.1 C  Resp: 22    Complications: No apparent anesthesia complications

## 2015-03-24 ENCOUNTER — Other Ambulatory Visit: Payer: Self-pay

## 2015-03-24 ENCOUNTER — Encounter: Payer: 59 | Admitting: Obstetrics and Gynecology

## 2015-03-24 ENCOUNTER — Encounter: Payer: Self-pay | Admitting: Obstetrics and Gynecology

## 2015-03-24 LAB — GLUCOSE, CAPILLARY
GLUCOSE-CAPILLARY: 111 mg/dL — AB (ref 65–99)
GLUCOSE-CAPILLARY: 152 mg/dL — AB (ref 65–99)
GLUCOSE-CAPILLARY: 92 mg/dL (ref 65–99)
Glucose-Capillary: 121 mg/dL — ABNORMAL HIGH (ref 65–99)
Glucose-Capillary: 141 mg/dL — ABNORMAL HIGH (ref 65–99)
Glucose-Capillary: 170 mg/dL — ABNORMAL HIGH (ref 65–99)

## 2015-03-24 LAB — CBC
HCT: 32.6 % — ABNORMAL LOW (ref 35.0–47.0)
HEMOGLOBIN: 10.1 g/dL — AB (ref 12.0–16.0)
MCH: 23.7 pg — AB (ref 26.0–34.0)
MCHC: 31.1 g/dL — ABNORMAL LOW (ref 32.0–36.0)
MCV: 76.2 fL — AB (ref 80.0–100.0)
PLATELETS: 216 10*3/uL (ref 150–440)
RBC: 4.28 MIL/uL (ref 3.80–5.20)
RDW: 14.5 % (ref 11.5–14.5)
WBC: 16.1 10*3/uL — AB (ref 3.6–11.0)

## 2015-03-24 LAB — RPR: RPR: NONREACTIVE

## 2015-03-24 LAB — ABO/RH: ABO/RH(D): O POS

## 2015-03-24 MED ORDER — METFORMIN HCL 500 MG PO TABS
500.0000 mg | ORAL_TABLET | Freq: Two times a day (BID) | ORAL | Status: DC
  Administered 2015-03-24 – 2015-03-27 (×7): 500 mg via ORAL
  Filled 2015-03-24 (×12): qty 1

## 2015-03-24 NOTE — Progress Notes (Signed)
Subjective: Postpartum Day 1: Cesarean Delivery at 33.[redacted] weeks gestation for nonreassuring fetal heart rate tracing; GDM A2 Patient reports incisional pain, tolerating PO, + flatus and no problems voiding.    Objective: Vital signs in last 24 hours: Temp:  [97.4 F (36.3 C)-98.8 F (37.1 C)] 98.4 F (36.9 C) (12/22 1152) Pulse Rate:  [59-198] 65 (12/22 1152) Resp:  [16-22] 18 (12/22 1152) BP: (98-142)/(52-104) 116/59 mmHg (12/22 1152) SpO2:  [99 %-100 %] 100 % (12/22 0408) Weight:  [227 lb (102.967 kg)] 227 lb (102.967 kg) (12/21 2036)  CBG (last 3)   Recent Labs  03/24/15 0044 03/24/15 0407 03/24/15 0832  GLUCAP 152* 111* 121*     Physical Exam:  General: alert and cooperative Lochia: appropriate Uterine Fundus: Not examined Incision: covered; dry DVT Evaluation: No cords or calf tenderness.   Recent Labs  03/23/15 1830 03/24/15 0635  HGB 11.5* 10.1*  HCT 36.7 32.6*    Assessment/Plan: Status post Cesarean section. Doing well postoperatively.  GDM A2-stable blood sugars; appreciate diabetic coordinator input  1. Metformin 500 mg twice a day with glucose monitoring 2. Routine postop care   Daphine DeutscherMartin A Bentleigh Stankus 03/24/2015, 1:39 PM

## 2015-03-24 NOTE — Progress Notes (Signed)
Inpatient Diabetes Program Recommendations  AACE/ADA: New Consensus Statement on Inpatient Glycemic Control (2015)  Target Ranges:  Prepandial:   less than 140 mg/dL      Peak postprandial:   less than 180 mg/dL (1-2 hours)      Critically ill patients:  140 - 180 mg/dL   Review of Glycemic Control  Diabetes history: Gestational diabetes Outpatient Diabetes medications: Novolog 24 units tid with meals, Lantus 34 units bid (from history), Lantus 36 units q AM,Lantus 36 units q PM, Humalog 26 units AM Humalog 24 Units Lunch,  Humalog 16 units PM (from Dr. Bridget HartshorneFrancesco's note dated 03/23/15 Current orders for Inpatient glycemic control: none noted  Inpatient Diabetes Program Recommendations: Patient received 2 courses of Betamethasone; 2nd dose of round 2 on 03/22/15- this is likely continuing to contribute to the elevated blood sugars. Consider sending the patient home on Metformin 500mg  bid, have patient check blood sugars tid pre-meals and hs, and return to MD within 2 weeks with blood sugars and meter for follow up assessment of blood sugar control.   Patient needs education regarding the need for ideal blood sugar control before her next pregnancy. May benefit from a referral to the [facility] for diet education to aid in blood sugar control and weight loss.  Susette RacerJulie Lavanya Roa, RN, BA, MHA, CDE Diabetes Coordinator Inpatient Diabetes Program  [phone] (Team Pager) [phone] [facility] Office) 03/24/2015 7:38 AM

## 2015-03-24 NOTE — Anesthesia Post-op Follow-up Note (Signed)
  Anesthesia Pain Follow-up Note  Patient: Brittany SchirmerDarneisha L Rhames  Day #: 1  Date of Follow-up: 03/24/2015 Time: 7:21 AM  Last Vitals:  Filed Vitals:   03/24/15 0038 03/24/15 0408  BP: 116/62 121/66  Pulse: 62 59  Temp: 36.7 C 36.7 C  Resp: 18 18    Level of Consciousness: alert  Pain: 4 /10   Side Effects:None  Catheter Site Exam:clean, dry, no drainage  Plan: D/C from anesthesia care  Starling Mannsurtis,  [place] Teters A

## 2015-03-24 NOTE — Lactation Note (Signed)
This note was copied from the chart of Girl Corey HaroldDarneisha Coomer. Lactation Consultation Note  Patient Name: Girl Corey HaroldDarneisha Thornley ZOXWR'UToday's Date: 03/24/2015 Reason for consult: Follow-up assessment   Maternal Data   Kerrie PleasureMoatehr is a type 2 diabetic and has a strong desire to breast feed. Feeding Feeding Type: Formula Length of feed: 30 min Mother is pumping for milk. She plans to call her insurance company to get a pump.     I have reviewed pumping in the initiation mode. I have reviewed the normal process of feeding a preterm baby and have given mother literature to review.   Lactation Tools Discussed/Used Tools: Pump WIC Program: No Pump Review: Setup, frequency, and cleaning;Milk Storage;Other (comment)   Consult Status Consult Status: PRN Follow-up type: In-patient    Trudee GripCarolyn P Caron Ode 03/24/2015, 5:58 PM

## 2015-03-24 NOTE — Anesthesia Postprocedure Evaluation (Signed)
Anesthesia Post Note  Patient: Brittany White  Procedure(s) Performed: Procedure(s) (LRB): CESAREAN SECTION (N/A)  Patient location during evaluation: Mother Baby Anesthesia Type: Spinal Level of consciousness: awake and alert Pain management: pain level controlled Vital Signs Assessment: post-procedure vital signs reviewed and stable Respiratory status: spontaneous breathing and respiratory function stable Cardiovascular status: blood pressure returned to baseline and stable Postop Assessment: spinal receding Anesthetic complications: no    Last Vitals:  Filed Vitals:   03/24/15 0038 03/24/15 0408  BP: 116/62 121/66  Pulse: 62 59  Temp: 36.7 C 36.7 C  Resp: 18 18    Last Pain:  Filed Vitals:   03/24/15 0409  PainSc: 0-No pain                 Starling Mannsurtis,  Cailen Mihalik A

## 2015-03-25 LAB — GLUCOSE, CAPILLARY
GLUCOSE-CAPILLARY: 87 mg/dL (ref 65–99)
Glucose-Capillary: 124 mg/dL — ABNORMAL HIGH (ref 65–99)
Glucose-Capillary: 111 mg/dL — ABNORMAL HIGH (ref 65–99)
Glucose-Capillary: 153 mg/dL — ABNORMAL HIGH (ref 65–99)

## 2015-03-25 LAB — SURGICAL PATHOLOGY

## 2015-03-25 NOTE — Progress Notes (Signed)
Subjective: Postpartum Day 2: Cesarean Delivery due to nonreassuring fetal heart rate tracing at 3.[redacted] weeks gestation; GDM A2 Patient reports tolerating PO, + flatus and no problems voiding.    Objective: Vital signs in last 24 hours: Temp:  [97.5 F (36.4 C)-98.4 F (36.9 C)] 98.2 F (36.8 C) (12/23 0802) Pulse Rate:  [61-70] 68 (12/23 0802) Resp:  [18-20] 18 (12/23 0802) BP: (100-116)/(48-64) 113/64 mmHg (12/23 0802) SpO2:  [100 %] 100 % (12/23 0802)  Physical Exam:  General: alert and cooperative Lochia: appropriate Uterine Fundus: firm Incision: healing well DVT Evaluation: No evidence of DVT seen on physical exam.   Recent Labs  03/23/15 1830 03/24/15 0635  HGB 11.5* 10.1*  HCT 36.7 32.6*   CBG (last 3)   Recent Labs  03/24/15 1604 03/24/15 2211 03/25/15 0749  GLUCAP 141* 170* 124*      Assessment/Plan: Status post Cesarean section. Doing well postoperatively.  Continue current care.Daphine Deutscher.  Trystan Akhtar A Roniyah Llorens 03/25/2015, 11:08 AM

## 2015-03-25 NOTE — Plan of Care (Signed)
Problem: Role Relationship: Goal: Ability to demonstrate positive interaction with newborn will improve Outcome: Completed/Met Date Met:  03/25/15 Infant in SCN but mom goes to visit frequently.

## 2015-03-26 LAB — GLUCOSE, CAPILLARY
GLUCOSE-CAPILLARY: 95 mg/dL (ref 65–99)
GLUCOSE-CAPILLARY: 92 mg/dL (ref 65–99)
Glucose-Capillary: 101 mg/dL — ABNORMAL HIGH (ref 65–99)

## 2015-03-26 NOTE — Progress Notes (Signed)
Subjective: Postpartum Day 3: Cesarean Delivery done secondary to nonreassuring fetal heart rate tracing at 33.6 weeks; GDM A2 Patient reports tolerating PO, + flatus and no problems voiding.   Patient is pumping her breast.  Objective: Vital signs in last 24 hours: Temp:  [98 F (36.7 C)-98.6 F (37 C)] 98.6 F (37 C) (12/24 0742) Pulse Rate:  [72-80] 80 (12/24 0742) Resp:  [18-20] 20 (12/24 0742) BP: (115-116)/(61-67) 115/61 mmHg (12/24 0742) SpO2:  [100 %] 100 % (12/23 2145)  Physical Exam:  General: alert and cooperative Lochia: appropriate Uterine Fundus: firm Incision: healing well, no significant drainage DVT Evaluation: No evidence of DVT seen on physical exam.   Recent Labs  03/23/15 1830 03/24/15 0635  HGB 11.5* 10.1*  HCT 36.7 32.6*   CBG (last 3)   Recent Labs  03/25/15 1707 03/25/15 2313 03/26/15 0740  GLUCAP 111* 153* 95     Assessment/Plan: Status post Cesarean section. Doing well postoperatively. Blood sugars acceptable on metformin 500 mg twice a day  Continue current care Discharge to home tomorrow.  Daphine DeutscherMartin A Irie Fiorello 03/26/2015, 11:15 AM

## 2015-03-27 LAB — GLUCOSE, CAPILLARY
Glucose-Capillary: 114 mg/dL — ABNORMAL HIGH (ref 65–99)
Glucose-Capillary: 104 mg/dL — ABNORMAL HIGH (ref 65–99)
Glucose-Capillary: 101 mg/dL — ABNORMAL HIGH (ref 65–99)

## 2015-03-27 MED ORDER — OXYCODONE-ACETAMINOPHEN 5-325 MG PO TABS: 2.0000 | ORAL_TABLET | ORAL | Status: DC | PRN

## 2015-03-27 MED ORDER — METFORMIN HCL 500 MG PO TABS: 500.0000 mg | ORAL_TABLET | Freq: Two times a day (BID) | ORAL | Status: DC

## 2015-03-27 MED ORDER — IBUPROFEN 800 MG PO TABS: 800.0000 mg | ORAL_TABLET | Freq: Three times a day (TID) | ORAL | Status: DC

## 2015-03-27 NOTE — Progress Notes (Signed)
Patient discharged home. Infant to stay in SCN. Patient's vital signs stable, bleeding within normal limits, uterus firm. Discharge instructions and follow up appointment reviewed with patient. Patient will need to make own follow-up appointment tomorrow as OBGYN is closed for holiday today. Patient also planning to come back to visit infant in SCN tonight and will pick up Percocet prescription at that time as it has not been signed by MD yet. Patient verbalized understanding, all questions answered. Patient ambulatory to car with family.

## 2015-03-27 NOTE — Discharge Summary (Signed)
Physician Obstetric Discharge Summary  Patient ID: Brittany White MRN: [id number] DOB/AGE: 04/12/1985 29 y.o.  Reason for Admission: 33.6 week IUP; Decreased FM; GDM A2 Prenatal Procedures: NST and ultrasound Intrapartum Procedures: cesarean: low cervical, transverse Postpartum Procedures:  None Complications-Operative and Postpartum: none  Delivery Note At 7:35 PM a viable female was delivered via C-Section, Low Transverse (Presentation:OA ).  APGAR: 8, 9; weight 6#12 .   Placenta status: Intact, Manual removal Pathology.  Cord: 3 vessels with the following complications: None.  Cord pH:7.20  Anesthesia: Spinal  Episiotomy:   Lacerations:   Suture Repair: NA Est. Blood Loss (mL):  750  Mom to postpartum.  Baby to NICU.  Brittany White 03/27/2015, 1:50 PM    APGAR: , ; weight  .    H/H:  Lab Results  Component Value Date/Time   HGB 10.1* 03/24/2015 06:35 AM   HGB 12.7 06/04/2012 08:47 AM   HCT 32.6* 03/24/2015 06:35 AM   HCT 35.3 11/11/2014 04:11 PM   HCT 40.3 06/04/2012 08:47 AM   CBG (last 3)   Recent Labs  03/26/15 2108 03/27/15 0754 03/27/15 1143  GLUCAP 92 114* 104*      Brief Hospital Course: Brittany White is a G1P0101 who underwent cesarean section on 03/23/2015.  Patient had an uncomplicated surgery; for further details of this surgery, please refer to the operative note.  Patient had an uncomplicated postpartum course.  By time of discharge on POD#3, her pain was controlled on oral pain medications; she had appropriate lochia and was ambulating, voiding without difficulty, tolerating regular diet and passing flatus.   She was deemed stable for discharge to home.    Discharge Diagnoses: 33.6 week IUP; Nonreassuring FHR Tracing; GDM A2; Vable Female 6#12  Discharge Information: Date: 03/27/2015 Activity: pelvic rest Diet: 1800 Cal ADA Diet Baby feeding: plans to breastfeed Contraception: condoms Medications: PNV, Ibuprofen, Colace and  Percocet Discharged Condition: good Instructions: refer to practice specific booklet Discharge to: home   Infant: remains in NICU  Signed: Herold HarmsMartin A Martavia Tye, MD 03/27/2015, 1:50 PM

## 2015-03-27 NOTE — Final Progress Note (Signed)
Pt ready for discharge. Instructions given yesterday. Pt may leave today prior to seeing MD.

## 2015-03-27 NOTE — Lactation Note (Signed)
This note was copied from the chart of Brittany Corey HaroldDarneisha Drakeford. Lactation Consultation Note Mom pumping for 33.6 week baby in SCN. Parents were told by company they work for that they could get pump at hospital and hospital would be reimbursed.  Explained to parents that we were not considered a [employer] and that [facility] if got from hospital.  Put them in contact with [employer] to check on getting pump.  Since it is Christmas Day and mom is being discharged today with Bella KennedyKyra having to remain here in SCN, mom was loaned a [employer] pump for 1 week.  She is to call us in a week if she still has not received her DEBP through [facility].  Rental agreement filled out, signed and in rental book.   Patient Name: Brittany White VOZDG'UToday's Date: 03/27/2015     Maternal Data    Feeding Feeding Type: Breast Milk Length of feed: 30 min  LATCH Score/Interventions                      Lactation Tools Discussed/Used Tools: 51F feeding tube / Syringe;Pump   Consult Status      Louis MeckelWilliams, Angelis Gates Kay 03/27/2015, 3:47 PM

## 2015-03-27 NOTE — Discharge Instructions (Signed)
Cesarean Delivery, Care After Refer to this sheet in the next few weeks. These instructions provide you with information on caring for yourself after your procedure. Your health care provider may also give you specific instructions. Your treatment has been planned according to current medical practices, but problems sometimes occur. Call your health care provider if you have any problems or questions after you go home. HOME CARE INSTRUCTIONS  Only take over-the-counter or prescription medications as directed by your health care provider.  Do not drink alcohol, especially if you are breastfeeding or taking medication to relieve pain.  Do not chew or smoke tobacco.  Continue to use good perineal care. Good perineal care includes:  Wiping your perineum from front to back.  Keeping your perineum clean.  Check your surgical cut (incision) daily for increased redness, drainage, swelling, or separation of skin.  Clean your incision gently with soap and water every day, and then pat it dry. If your health care provider says it is okay, leave the incision uncovered. Use a bandage (dressing) if the incision is draining fluid or appears irritated. If the adhesive strips across the incision do not fall off within 7 days, carefully peel them off.  Hug a pillow when coughing or sneezing until your incision is healed. This helps to relieve pain.  No tampons, douching, or sexual intercourse for the next 6 weeks.   Showers only, no tub baths for the next six weeks.   Wear a well-fitting bra that provides breast support.  Limit wearing support panties or control-top hose.  Drink enough fluids to keep your urine clear or pale yellow.  Eat high-fiber foods such as whole grain cereals and breads, brown rice, beans, and fresh fruits and vegetables every day. These foods may help prevent or relieve constipation.  Resume activities such as climbing stairs, driving, lifting, exercising, or traveling as  directed by your health care provider.  Talk to your health care provider about resuming sexual activities. This is dependent upon your risk of infection, your rate of healing, and your comfort and desire to resume sexual activity.  Try to have someone help you with your household activities and your newborn for at least a few days after you leave the hospital.  Rest as much as possible. Try to rest or take a nap when your newborn is sleeping.  Increase your activities gradually.  Keep all of your scheduled postpartum appointments. It is very important to keep your scheduled follow-up appointments. At these appointments, your health care provider will be checking to make sure that you are healing physically and emotionally. SEEK MEDICAL CARE IF:   You are passing large clots from your vagina. Save any clots to show your health care provider.  You have a foul smelling discharge from your vagina.  You have trouble urinating.  You are urinating frequently.  You have pain when you urinate.  You have a change in your bowel movements.  You have increasing redness, pain, or swelling near your incision.  You have pus draining from your incision.  Your incision is separating.  You have painful, hard, or reddened breasts.  You have a severe headache.  You have blurred vision or see spots.  You feel sad or depressed.  You have thoughts of hurting yourself or your newborn.  You have questions about your care, the care of your newborn, or medications.  You are dizzy or light-headed.  You have a rash.  You have pain, redness, or swelling at the  site of the removed intravenous access (IV) tube.  You have nausea or vomiting.  You stopped breastfeeding and have not had a menstrual period within 12 weeks of stopping.  You are not breastfeeding and have not had a menstrual period within 12 weeks of delivery.  You have a fever. SEEK IMMEDIATE MEDICAL CARE IF:  You have  persistent pain.  You have chest pain.  You have shortness of breath.  You faint.  You have leg pain.  You have stomach pain.  Your vaginal bleeding saturates 2 or more sanitary pads in 1 hour. MAKE SURE YOU:   Understand these instructions.  Will watch your condition.  Will get help right away if you are not doing well or get worse.   This information is not intended to replace advice given to you by your health care provider. Make sure you discuss any questions you have with your health care provider.   Document Released: 12/09/2001 Document Revised: 04/09/2014 Document Reviewed: 11/14/2011 Elsevier Interactive Patient Education [employer]

## 2015-03-28 ENCOUNTER — Other Ambulatory Visit: Payer: Self-pay

## 2015-03-29 ENCOUNTER — Ambulatory Visit (INDEPENDENT_AMBULATORY_CARE_PROVIDER_SITE_OTHER): Payer: 59 | Admitting: Obstetrics and Gynecology

## 2015-03-29 ENCOUNTER — Encounter: Payer: Self-pay | Admitting: Obstetrics and Gynecology

## 2015-03-29 VITALS — BP 119/70 | HR 84 | Wt 215.2 lb

## 2015-03-29 DIAGNOSIS — O24414 Gestational diabetes mellitus in pregnancy, insulin controlled: Secondary | ICD-10-CM

## 2015-03-29 DIAGNOSIS — Z9889 Other specified postprocedural states: Secondary | ICD-10-CM

## 2015-03-29 DIAGNOSIS — Z98891 History of uterine scar from previous surgery: Secondary | ICD-10-CM

## 2015-03-29 MED ORDER — IBUPROFEN 800 MG PO TABS: 800.0000 mg | ORAL_TABLET | Freq: Three times a day (TID) | ORAL | Status: DC

## 2015-03-29 MED ORDER — METFORMIN HCL 500 MG PO TABS: 500.0000 mg | ORAL_TABLET | Freq: Two times a day (BID) | ORAL | Status: DC

## 2015-03-29 NOTE — Patient Instructions (Signed)
Hypoglycemia Low blood sugar (hypoglycemia) means that the level of sugar in your blood is lower than it should be. Signs of low blood sugar include:  Getting sweaty.  Feeling hungry.  Feeling dizzy or weak.  Feeling sleepier than normal.  Feeling nervous.  Headaches.  Having a fast heartbeat. Low blood sugar can happen fast and can be an emergency. Your doctor can do tests to check your blood sugar level. You can have low blood sugar and not have diabetes. HOME CARE  Check your blood sugar as told by your doctor. If it is less than 70 mg/dl or as told by your doctor, take 1 of the following:  3 to 4 glucose tablets.   cup clear juice.   cup soda pop, not diet.  1 cup milk.  5 to 6 hard candies.  Recheck blood sugar after 15 minutes. Repeat until it is at the right level.  Eat a snack if it is more than 1 hour until the next meal.  Only take medicine as told by your doctor.  Do not skip meals. Eat on time.  Do not drink alcohol except with meals.  Check your blood glucose before driving.  Check your blood glucose before and after exercise.  Always carry treatment with you, such as glucose pills.  Always wear a medical alert bracelet if you have diabetes. GET HELP RIGHT AWAY IF:   Your blood glucose goes below 70 mg/dl or as told by your doctor, and you:  Are confused.  Are not able to swallow.  Pass out (faint).  You cannot treat yourself. You may need someone to help you.  You have low blood sugar problems often.  You have problems from your medicines.  You are not feeling better after 3 to 4 days.  You have vision changes. MAKE SURE YOU:   Understand these instructions.  Will watch this condition.  Will get help right away if you are not doing well or get worse.   This information is not intended to replace advice given to you by your health care provider. Make sure you discuss any questions you have with your health care provider.     Document Released: 06/13/2009 Document Revised: 04/09/2014 Document Reviewed: 11/23/2014 Elsevier Interactive Patient Education 2016 Elsevier Inc.  

## 2015-03-29 NOTE — Progress Notes (Signed)
OBSTETRIC POST-OPERATIVE VISIT  Subjective:     Shirleen SchirmerDarneisha L Hey is a 29 y.o. [id number] female who presents to the clinic 1 weeks status post primary low transverse C-section for NRFHT.  Also with PPROM at 33.6 weeks. Eating a regular diet without difficulty. Bowel movements are normal. Pain is controlled with current analgesics. Medications being used: prescription NSAID's including ibuprofen (Motrin) and narcotic analgesics including Percocet.  Notes soreness at incision site. Reports that blood sugars are much better controlled, with fastings in the 80s, and postprandials in the 100s. Reports compliance with Metformin 500 mg BID.   The following portions of the patient's history were reviewed and updated as appropriate: allergies, current medications, past family history, past medical history, past social history, past surgical history and problem list.  Review of Systems A comprehensive review of systems was negative except for: Endocrine: positive for diabetic symptoms including night sweats, chills, shakes (occasionally at night, sometimes during the day)    Objective:    BP 119/70 mmHg  Pulse 84  Wt 215 lb 3.2 oz (97.614 kg) General:  alert and no distress  Abdomen: soft, bowel sounds active, appropriately tender near incision site  Incision:   healing well, no drainage, no erythema, no hernia, no seroma, no swelling, no dehiscence, incision well approximated     Assessment:    Doing well postoperatively.  1 week s/p PLTCS. Gestational Diabetes, on Metformin, with possible hypoglycemic episodes   Plan:    1. Continue any current medications. Patient needs refill on Motrin and Metformin. 2. Wound care discussed. 3. Activity restrictions: no bending, stooping, or squatting, no lifting more than 15 pounds and pelvic rest x 5 weeks 4. Anticipated return to work: in 5 weeks. 5.  Discussion had with patient regarding possible hypoglycemic episodes. Advised that if she has another  night episode of "sweating/chills", to check blood sugars.  If very low (below 60), may need to d/c evening dose of Metformin.   6. Follow up: 5 weeks for postpartum visit.     Hildred LaserAnika Khian Remo, MD [facility]

## 2015-03-31 ENCOUNTER — Other Ambulatory Visit: Payer: Self-pay

## 2015-03-31 ENCOUNTER — Encounter: Payer: Self-pay | Admitting: Obstetrics and Gynecology

## 2015-04-01 ENCOUNTER — Ambulatory Visit: Payer: Self-pay

## 2015-04-01 NOTE — Lactation Note (Signed)
This note was copied from the chart of Brittany Corey HaroldDarneisha Scriven. Lactation Consultation Note  Patient Name: Brittany White WUJWJ'XToday's Date: 04/01/2015     Maternal Data  Mom continues to pump every 3 hrs and obtains 2 full gradufeed bottles each session, she has decided to just bottle feed her breastmilk until baby is d/c'd to home.   Feeding    LATCH Score/Interventions                      Lactation Tools Discussed/Used     Consult Status      [name] 04/01/2015, 1:57 PM

## 2015-04-04 ENCOUNTER — Other Ambulatory Visit: Payer: Self-pay

## 2015-04-05 ENCOUNTER — Other Ambulatory Visit: Payer: Self-pay

## 2015-04-05 ENCOUNTER — Encounter: Payer: Self-pay | Admitting: Obstetrics and Gynecology

## 2015-04-05 ENCOUNTER — Other Ambulatory Visit: Payer: 59

## 2015-04-05 DIAGNOSIS — R399 Unspecified symptoms and signs involving the genitourinary system: Secondary | ICD-10-CM

## 2015-04-07 ENCOUNTER — Ambulatory Visit: Payer: Self-pay

## 2015-04-07 LAB — URINE CULTURE

## 2015-04-07 NOTE — Lactation Note (Signed)
This note was copied from the chart of [name]. Lactation Consultation Note  Patient Name: [name] ZOXWR'UToday's Date: 04/07/2015 Reason for consult: Initial assessment  Mother believes that she should wait until discharge to start breast feeding. We discussed the benefits of breast feeding once a day now. She also had the impression that during the breast feeding the baby was burning too many calories. Seh also stated that this would prevent the baby from getting discharged.  I tried to clarify some of this for her.   Feeding Feeding Type: Bottle Fed - Breast Milk (see note) Nipple Type: Slow - flow Length of feed: 30 min  LATCH Score/Interventions                      Lactation Tools Discussed/Used     Consult Status      Trudee GripCarolyn P Ousman Dise 04/07/2015, 3:56 PM

## 2015-04-08 MED ORDER — AMOXICILLIN-POT CLAVULANATE 875-125 MG PO TABS: 1.0000 | ORAL_TABLET | Freq: Two times a day (BID) | ORAL | Status: DC

## 2015-04-11 ENCOUNTER — Ambulatory Visit: Payer: Self-pay

## 2015-04-13 ENCOUNTER — Other Ambulatory Visit: Payer: Self-pay

## 2015-04-13 DIAGNOSIS — O8620 Urinary tract infection following delivery, unspecified: Secondary | ICD-10-CM

## 2015-04-13 MED ORDER — UROGESIC-BLUE 81.6 MG PO TABS: 1.0000 | ORAL_TABLET | Freq: Two times a day (BID) | ORAL | Status: DC

## 2015-04-13 NOTE — Progress Notes (Signed)
Pt sends myChart message states that she is still having burning with urination despite antibiotic use and AZO, advised pt I would send script for Urogesic Blue. Advised pt of short term use of the medication as she is lactating.

## 2015-04-14 ENCOUNTER — Ambulatory Visit (INDEPENDENT_AMBULATORY_CARE_PROVIDER_SITE_OTHER): Payer: 59 | Admitting: Obstetrics and Gynecology

## 2015-04-14 VITALS — BP 105/66 | HR 65 | Ht 64.0 in | Wt 211.4 lb

## 2015-04-14 DIAGNOSIS — B373 Candidiasis of vulva and vagina: Secondary | ICD-10-CM

## 2015-04-14 DIAGNOSIS — N9089 Other specified noninflammatory disorders of vulva and perineum: Secondary | ICD-10-CM | POA: Diagnosis not present

## 2015-04-14 DIAGNOSIS — B3731 Acute candidiasis of vulva and vagina: Secondary | ICD-10-CM

## 2015-04-14 MED ORDER — FLUCONAZOLE 150 MG PO TABS: 150.0000 mg | ORAL_TABLET | Freq: Once | ORAL | Status: DC

## 2015-04-14 NOTE — Progress Notes (Signed)
    GYNECOLOGY PROGRESS NOTE  Subjective:    Patient ID: Shirleen Schirmerarneisha L Snowdon, female    DOB: May 07, 1985, 30 y.o.   MRN: [id number]  HPI  Patient is a 30 y.o. [id number] female who presents for vaginal irritation and labial swelling for several days. Denies any vaginal discharge, lesions.  Patient notes recent antibiotic use for treatment for UTI.    The following portions of the patient's history were reviewed and updated as appropriate: allergies, current medications, past family history, past medical history, past social history, past surgical history and problem list.  Review of Systems Pertinent items noted in HPI and remainder of comprehensive ROS otherwise negative.   Objective:   Blood pressure 105/66, pulse 65, height 5\' 4"  (1.626 m), weight 211 lb 6.4 oz (95.89 kg), currently breastfeeding. General appearance: alert and no distress Abdomen: soft, non-tender; bowel sounds normal; no masses,  no organomegaly Pelvic: cervix normal in appearance, external genitalia normal, no adnexal masses or tenderness, no cervical motion tenderness, positive findings: vaginal discharge:  scant, thick, curd-like and odorless, rectovaginal septum normal and uterus normal size, shape, and consistency    Labs:  Microscopic wet-mount exam shows negative for clue cells.  Normal few epithelial cells, lactobacilli present. KOH done, with hyphae.  Assessment:   Yeast vaginitis  Plan:    Will treat with Diflucan 150 mg PO x 1 dose, with refills.  Advised on Vagisil for external vulvar symptoms.  To f/u as needed.    Hildred LaserAnika Jacque Garrels, MD [facility]

## 2015-04-26 ENCOUNTER — Encounter: Payer: Self-pay | Admitting: Internal Medicine

## 2015-05-08 ENCOUNTER — Encounter: Payer: Self-pay | Admitting: Obstetrics and Gynecology

## 2015-05-10 ENCOUNTER — Encounter: Payer: Self-pay | Admitting: Obstetrics and Gynecology

## 2015-05-10 ENCOUNTER — Ambulatory Visit (INDEPENDENT_AMBULATORY_CARE_PROVIDER_SITE_OTHER): Payer: 59 | Admitting: Obstetrics and Gynecology

## 2015-05-10 ENCOUNTER — Telehealth: Payer: Self-pay | Admitting: Obstetrics and Gynecology

## 2015-05-10 VITALS — BP 111/66 | HR 75 | Ht 64.0 in | Wt 211.6 lb

## 2015-05-10 DIAGNOSIS — Z87440 Personal history of urinary (tract) infections: Secondary | ICD-10-CM

## 2015-05-10 DIAGNOSIS — O24414 Gestational diabetes mellitus in pregnancy, insulin controlled: Secondary | ICD-10-CM

## 2015-05-10 DIAGNOSIS — N949 Unspecified condition associated with female genital organs and menstrual cycle: Secondary | ICD-10-CM

## 2015-05-10 MED ORDER — NORGESTIM-ETH ESTRAD TRIPHASIC 0.18/0.215/0.25 MG-25 MCG PO TABS: 1.0000 | ORAL_TABLET | Freq: Every day | ORAL | Status: DC

## 2015-05-10 MED ORDER — IBUPROFEN 800 MG PO TABS: 800.0000 mg | ORAL_TABLET | Freq: Three times a day (TID) | ORAL | Status: DC

## 2015-05-10 MED ORDER — CYCLOBENZAPRINE HCL 10 MG PO TABS: 10.0000 mg | ORAL_TABLET | Freq: Three times a day (TID) | ORAL | Status: DC | PRN

## 2015-05-10 NOTE — Telephone Encounter (Signed)
Please inform he that I have resubmitted her other prescriptions, should be available.  Please let us know if she has any more problems and we can fax/call in the prescription.

## 2015-05-10 NOTE — Telephone Encounter (Signed)
Pt called and stated that her pharmacy doesn't have the [facility] or the ibuprofen, they received the muscle relaxer but nothing else.

## 2015-05-10 NOTE — Telephone Encounter (Signed)
done

## 2015-05-10 NOTE — Progress Notes (Signed)
   OBSTETRIC POSTPARTUM CLINIC VISIT  Subjective:     Brittany White is a 30 y.o. G50P0101 female who presents for a postpartum visit. She is 6 weeks postpartum following a low cervical transverse Cesarean section. I have fully reviewed the prenatal and intrapartum course. The delivery was at 33.6 gestational weeks for PPROM.  Pregnancy was complicated by gestational diabetes (uncontrolled on insulin)  Anesthesia: epidural. Postpartum course has been well. Baby's course has been well. Baby is feeding by breast. Bleeding no bleeding currently, patient thinks she resumed menses last week (lasted 2 days). Bowel function is abnormal: mild constipation. Bladder function is normal. Patient is not sexually active. Contraception method is OCP (estrogen/progesterone). Postpartum depression screening: negative.  The following portions of the patient's history were reviewed and updated as appropriate: allergies, current medications, past family history, past medical history, past social history, past surgical history and problem list.  Review of Systems A comprehensive review of systems was negative except for: Gastrointestinal: positive for constipation Genitourinary: positive for vaginal discharge (recently treated for vaginal yeast infection) and recent urinary tract infection treated (still notes residual symptoms) Musculoskeletal: positive for back pain and muscle spasms  Objective:    BP 111/66 mmHg  Pulse 75  Ht  (1.626 m)  Wt 211 lb 9.6 oz (95.981 kg)  BMI 36.30 kg/m2  Breastfeeding? Yes  General:  alert and no distress   Breasts:  inspection negative, no nipple discharge or bleeding, no masses or nodularity palpable  Lungs: clear to auscultation bilaterally  Heart:  regular rate and rhythm, S1, S2 normal, no murmur, click, rub or gallop  Abdomen: soft, non-tender; bowel sounds normal; no masses,  no organomegaly.  Pfannensteil incision site well healed.    Vulva:  normal  Vagina:  vagina positive for vaginal discharge (scant yellow-tan), no odor  Cervix:  no cervical motion tenderness and no lesions  Corpus: normal size, contour, position, consistency, mobility, non-tender  Adnexa:  normal adnexa and no mass, fullness, tenderness  Rectal Exam: Not performed.         Labs:  Lab Results  Component Value Date   WBC 16.1* 03/24/2015   HGB 10.1* 03/24/2015   HCT 32.6* 03/24/2015   MCV 76.2* 03/24/2015   PLT 216 03/24/2015     Microscopic wet-mount exam shows negative for pathogens, normal epithelial cells.  Assessment:   Routine postpartum exam. Pap smear not done at today's visit.   Gestational diabetes Mild postpartum anemia (asymptomatic) Vaginal/pelvic discomfort  Plan:    1. Contraception: OCP (estrogen/progesterone).  Previously on Ortho-Tri-Cyclen, but concerned about weight gain.  Will prescribe lower dose triphasic pill.   2. Will need 2 hr glucola for h/o GDM.  To perform in the next 1-2 weeks.  3. Vaginal/pelvic discomfort.  Wet prep negative.  Will f/u on UA and culture (TOC) for recent UTI (GBS).    4.  Prescribed Flexeril for back pain.  Also refilled Ibuprofen at patient's request.  5. Mild anemia, patient receiving iron through daily PNV and nutrition.  6. Follow up in: 6 months for annual exam or as needed.  Hildred Laser, MD [facility]

## 2015-05-11 ENCOUNTER — Telehealth: Payer: Self-pay | Admitting: Obstetrics and Gynecology

## 2015-05-11 ENCOUNTER — Encounter: Payer: Self-pay | Admitting: Obstetrics and Gynecology

## 2015-05-11 LAB — URINALYSIS, ROUTINE W REFLEX MICROSCOPIC
BILIRUBIN UA: NEGATIVE
Glucose, UA: NEGATIVE
Ketones, UA: NEGATIVE
Nitrite, UA: NEGATIVE
PH UA: 5.5 (ref 5.0–7.5)
Protein, UA: NEGATIVE
RBC UA: NEGATIVE
Specific Gravity, UA: 1.019 (ref 1.005–1.030)
UUROB: 0.2 mg/dL (ref 0.2–1.0)

## 2015-05-11 LAB — URINE CULTURE: ORGANISM ID, BACTERIA: NO GROWTH

## 2015-05-11 LAB — MICROSCOPIC EXAMINATION: Casts: NONE SEEN /lpf

## 2015-05-11 NOTE — Telephone Encounter (Signed)
Spoke with pt she states that is very concerned about the OCP you prescribed decreasing her milk supply. She states that she is not sure that this is best for her. I tried explaining to the pt in depth that everyone's body is different and she should not solely base her decisions on what she read online. Pt is not satisfied with my answer will you please reply to pt via myChart message that I am going to forward to you.

## 2015-05-11 NOTE — Telephone Encounter (Signed)
You prescribed Brittany White... Mylan BC. She said research says it dries up breast milk. Cab=n you prercibe something with pro estrogen only. She wants you to call her.

## 2015-05-12 NOTE — Telephone Encounter (Signed)
Responded to patient regarding concerns through MyChart message.

## 2015-05-17 ENCOUNTER — Other Ambulatory Visit: Payer: 59

## 2015-05-17 DIAGNOSIS — O24414 Gestational diabetes mellitus in pregnancy, insulin controlled: Secondary | ICD-10-CM

## 2015-05-18 LAB — GLUCOSE TOLERANCE, 2 HOURS
GLUCOSE FASTING GTT: 80 mg/dL (ref 65–99)
GLUCOSE, 2 HOUR: 87 mg/dL (ref 65–139)

## 2015-05-25 ENCOUNTER — Encounter: Payer: Self-pay | Admitting: Obstetrics and Gynecology

## 2015-06-01 ENCOUNTER — Encounter: Payer: Self-pay | Admitting: Obstetrics and Gynecology

## 2015-06-01 ENCOUNTER — Ambulatory Visit (INDEPENDENT_AMBULATORY_CARE_PROVIDER_SITE_OTHER): Payer: 59 | Admitting: Internal Medicine

## 2015-06-01 ENCOUNTER — Encounter: Payer: Self-pay | Admitting: Internal Medicine

## 2015-06-01 VITALS — BP 118/70 | HR 84 | Temp 97.9°F | Wt 211.0 lb

## 2015-06-01 DIAGNOSIS — J029 Acute pharyngitis, unspecified: Secondary | ICD-10-CM | POA: Diagnosis not present

## 2015-06-01 LAB — POCT RAPID STREP A (OFFICE): Rapid Strep A Screen: NEGATIVE

## 2015-06-01 NOTE — Progress Notes (Signed)
Pre visit review using our clinic review tool, if applicable. No additional management support is needed unless otherwise documented below in the visit note. 

## 2015-06-01 NOTE — Progress Notes (Signed)
   Subjective:    Patient ID: Brittany White, female    DOB: 09-18-1985, 30 y.o.   MRN: [id number]  HPI Here due to sore throat  Started 2 days ago At first, just with swallowing. Now all the time Affected voice No cough No rhinorrhea or congestion Temp 99.1 this AM Chills and sweats last night  Some muscle aches--but relates to exercise program No ear pain No SOB  Tried OTC aide--- like theraflu Cough drops  Current Outpatient Prescriptions on File Prior to Visit  Medication Sig Dispense Refill  . acetaminophen (TYLENOL) 500 MG tablet Take 1,000 mg by mouth every 6 (six) hours as needed.    . cyclobenzaprine (FLEXERIL) 10 MG tablet Take 1 tablet (10 mg total) by mouth 3 (three) times daily as needed for muscle spasms. 30 tablet 2  . ibuprofen (ADVIL,MOTRIN) 800 MG tablet Take 1 tablet (800 mg total) by mouth every 8 (eight) hours. 60 tablet 1  . Norgestimate-Ethinyl Estradiol Triphasic 0.18/0.215/0.25 MG-25 MCG tab Take 1 tablet by mouth daily. 1 Package 11  . Prenatal Vit-Fe Fumarate-FA (MULTIVITAMIN-PRENATAL) 27-0.8 MG TABS tablet Take 1 tablet by mouth daily at 12 noon.     No current facility-administered medications on file prior to visit.    No Known Allergies  Past Medical History  Diagnosis Date  . Anxiety   . Hypertension   . Chronic hypertension complicating or reason for care during pregnancy 10/14/2014    controlled by lifestyle modification, on no meds  . Headache     Past Surgical History  Procedure Laterality Date  . No past surgeries    . Cesarean section N/A 03/23/2015    Procedure: CESAREAN SECTION;  Surgeon: Herold Harms, MD;  Location: [facility] ORS;  Service: Obstetrics;  Laterality: N/A;    Family History  Problem Relation Age of Onset  . Heart disease Maternal Aunt   . Heart disease Maternal Grandmother   . Diabetes Maternal Grandmother   . Hypertension Paternal Grandmother   . Heart disease Paternal Grandmother   . Diabetes  Paternal Grandmother   . Hypertension Paternal Grandfather   . Cancer Paternal Grandfather     PROSTATE  . Gestational diabetes Sister   . Diabetes Maternal Grandfather     Social History   Social History  . Marital Status: Single    Spouse Name: N/A  . Number of Children: N/A  . Years of Education: N/A   Occupational History  . Not on file.   Social History Main Topics  . Smoking status: Never Smoker   . Smokeless tobacco: Never Used  . Alcohol Use: No  . Drug Use: No  . Sexual Activity: Yes    Birth Control/ Protection: None   Other Topics Concern  . Not on file   Social History Narrative   Review of Systems  Nursing baby No rash No vomiting or diarrhea No abdominal pain Appetite is okay     Objective:   Physical Exam  Constitutional: She appears well-developed. No distress.  HENT:  No sinus tenderness TMs normal Mild nasal swelling only 2+ tonsils with injection and exudates  Neck: Normal range of motion. Neck supple. No thyromegaly present.  Pulmonary/Chest: Effort normal and breath sounds normal. No respiratory distress. She has no wheezes. She has no rales.  Lymphadenopathy:    She has no cervical adenopathy.          Assessment & Plan:

## 2015-06-01 NOTE — Assessment & Plan Note (Signed)
Rapid strep negative Has laryngitis also--this supports viral etiology Discussed supportive care---acetaminophen and any local Rx

## 2015-06-02 ENCOUNTER — Ambulatory Visit: Payer: Self-pay | Admitting: Internal Medicine

## 2015-08-24 ENCOUNTER — Encounter: Payer: Self-pay | Admitting: Internal Medicine

## 2015-11-08 ENCOUNTER — Ambulatory Visit: Payer: Self-pay | Admitting: Internal Medicine

## 2016-02-25 ENCOUNTER — Other Ambulatory Visit: Payer: Self-pay | Admitting: Obstetrics and Gynecology

## 2016-03-27 ENCOUNTER — Telehealth: Payer: Self-pay | Admitting: Internal Medicine

## 2016-03-27 NOTE — Telephone Encounter (Signed)
Spoke with patient regarding her displeasure with having to wait to schedule a "transfer appointment" to a different provider.  Explained that as a courtesy to providers, we would send them a message to let them know that a current [facility] patient is requesting a transfer.  Patient said this was not good for her and "that is not a courtesy to me, I am paying the bills",  she wants an appointment now.  I let the patient know that current PCP Brittany White would be in the office tomorrow and we should be able to get a response fairly quickly as Dr. [name] is also in the office this week.  That was not quick enough for patient. She also states that [name] will give her medications for 6 months instead of having to come in every month as Brittany White was asking her to do. Patient said the medication was for her metabolism.  Patient was very agitated and did not want to listen to any explanation other than getting the appointment with [name].  Patient was continuously interrupting and said she needed to speak to my manager and then wanted the name of her manager.  Gave patient the number for the Office of Patient Experience.  After asking if she needed anything additionally today, she asked to be scheduled [facility]  Pt scheduled 03/30/16 3 pm with current PCP, transferred to voice mail for mgr Brittany White.  Sent in-basket messages to providers Brittany White and Brittany White requesting transfer.

## 2016-03-27 NOTE — Telephone Encounter (Signed)
Pt called requesting to change providers from [facility]Brittany White to Dr Sharen HonesGutierrez and to also get an acute appointment.  I told her that we do not usually allow changes between providers but I would be happy to send a message to both of the providers, and that when they responded, I would be able to make the change if they both were ok with it.  She started to make the appointment with her current pcp, and she kept stating things like we don't think her health care matters and that we don't give her any choices.  She also told me that it shouldn't be up to me to decide who her pcp is. Before I could make the appointment, she asked to be transferred to "someone higher" than me.

## 2016-03-28 NOTE — Telephone Encounter (Signed)
I do not treat her any differently than I do any of my patients. Any patient of mine on Phentermine is seen monthly to assess weight loss, and monitor BP and heart rate. If she wants to switch, that is fine with me as long as okay with Dr. Reece AgarG.

## 2016-03-28 NOTE — Telephone Encounter (Signed)
[name] has been managing phentermine appropriately.  Agree with f/u with current PCP, then if desires transfer ok to do this (I see husband) but notify pt will be a wait to see me as I'm currently pretty full with new patients.

## 2016-03-28 NOTE — Telephone Encounter (Signed)
I called the patient this afternoon per [facility] request to [facility].  I explained that we always encourage patients to stay with one provider as that provides the best continuity of care and is in the patient's best interest.  I explained that if a patient wishes to transfer we will speak with the involved providers to ensure they are comfortable with the request.  I stated that there may be times when a provider will not accept the patient transfer, for example if a patient has a complex medical condition that the provider may not be comfortable treating they may not take the patient.  She stated that remained dissatisfied with how our company handles these types of requests and that the patient controls how they get their care.  She said that she went to corporate to complain because we should be serving the patients, not the providers and that its clear we only care about the providers.  I apologized if she felt we werent meeting her expectations and if there was some breakdown in communication.  The phone connection became distorted and she was upset I could not hear her.  Then she hung up.  I did not get an opportunity to tell her Dr. Reece AgarG would see if she still wants to transfer after the appt on the 29th.

## 2016-03-30 ENCOUNTER — Ambulatory Visit: Payer: Self-pay | Admitting: Internal Medicine

## 2016-04-06 ENCOUNTER — Ambulatory Visit (INDEPENDENT_AMBULATORY_CARE_PROVIDER_SITE_OTHER): Payer: 59 | Admitting: Internal Medicine

## 2016-04-06 ENCOUNTER — Encounter: Payer: Self-pay | Admitting: Internal Medicine

## 2016-04-06 VITALS — BP 122/78 | HR 74 | Temp 98.5°F | Wt 215.0 lb

## 2016-04-06 DIAGNOSIS — N920 Excessive and frequent menstruation with regular cycle: Secondary | ICD-10-CM

## 2016-04-06 DIAGNOSIS — R5383 Other fatigue: Secondary | ICD-10-CM | POA: Diagnosis not present

## 2016-04-06 DIAGNOSIS — R102 Pelvic and perineal pain: Secondary | ICD-10-CM

## 2016-04-06 DIAGNOSIS — N939 Abnormal uterine and vaginal bleeding, unspecified: Secondary | ICD-10-CM

## 2016-04-06 DIAGNOSIS — E669 Obesity, unspecified: Secondary | ICD-10-CM

## 2016-04-06 DIAGNOSIS — R633 Feeding difficulties: Secondary | ICD-10-CM | POA: Diagnosis not present

## 2016-04-06 DIAGNOSIS — R6339 Other feeding difficulties: Secondary | ICD-10-CM

## 2016-04-06 DIAGNOSIS — Z789 Other specified health status: Secondary | ICD-10-CM

## 2016-04-06 DIAGNOSIS — R110 Nausea: Secondary | ICD-10-CM

## 2016-04-06 NOTE — Progress Notes (Signed)
Subjective:    Patient ID: Brittany White, female    DOB: [birth year], 31 y.o.   MRN: [id number]  HPI  Pt presents to the clinic today to discuss getting some medication to dry up her breast milk so that she can go back on the Phentermine. She was last on the Phentermine 04/2014-08/2014, prior to her most recent pregnancy. She is currently pumping every 2-3 hours.  She is also concerned that she may be pregnant. She has been experiencing some fatigue, brown vaginal spotting, nausea, food aversions and pelvic cramping. This started about 2 weeks ago. She reports she has continue to breastfeed and take her OCP's. Her LMP was 03/16/16.  Review of Systems      Past Medical History:  Diagnosis Date  . Anxiety   . Chronic hypertension complicating or reason for care during pregnancy 10/14/2014   controlled by lifestyle modification, on no meds  . Headache   . Hypertension     Current Outpatient Prescriptions  Medication Sig Dispense Refill  . acetaminophen (TYLENOL) 500 MG tablet Take 1,000 mg by mouth every 6 (six) hours as needed.    . cyclobenzaprine (FLEXERIL) 10 MG tablet Take 1 tablet (10 mg total) by mouth 3 (three) times daily as needed for muscle spasms. 30 tablet 2  . ibuprofen (ADVIL,MOTRIN) 800 MG tablet Take 1 tablet (800 mg total) by mouth every 8 (eight) hours. 60 tablet 1  . Prenatal Vit-Fe Fumarate-FA (MULTIVITAMIN-PRENATAL) 27-0.8 MG TABS tablet Take 1 tablet by mouth daily at 12 noon.    . TRI-LO-SPRINTEC 0.18/0.215/0.25 MG-25 MCG tab TAKE 1 TABLET BY MOUTH DAILY. 28 tablet 4   No current facility-administered medications for this visit.     No Known Allergies  Family History  Problem Relation Age of Onset  . Heart disease Maternal Aunt   . Heart disease Maternal Grandmother   . Diabetes Maternal Grandmother   . Hypertension Paternal Grandmother   . Heart disease Paternal Grandmother   . Diabetes Paternal Grandmother   . Hypertension Paternal Grandfather     . Cancer Paternal Grandfather     PROSTATE  . Gestational diabetes Sister   . Diabetes Maternal Grandfather     Social History   Social History  . Marital status: Single    Spouse name: N/A  . Number of children: N/A  . Years of education: N/A   Occupational History  . Not on file.   Social History Main Topics  . Smoking status: Never Smoker  . Smokeless tobacco: Never Used  . Alcohol use No  . Drug use: No  . Sexual activity: Yes    Birth control/ protection: None   Other Topics Concern  . Not on file   Social History Narrative  . No narrative on file     Constitutional: Pt reports fatigue. Denies fever, malaise, headache or abrupt weight changes.  Respiratory: Denies difficulty breathing, shortness of breath, cough or sputum production.   Cardiovascular: Denies chest pain, chest tightness, palpitations or swelling in the hands or feet.  Gastrointestinal: Pt reports nausea and food aversions. Denies abdominal pain, bloating, constipation, diarrhea or blood in the stool.  GU: Pt reports vaginal spotting. Denies urgency, frequency, pain with urination, burning sensation, blood in urine, odor. Neurological: Denies dizziness, difficulty with memory, difficulty with speech or problems with balance and coordination.    No other specific complaints in a complete review of systems (except as listed in HPI above).  Objective:   Physical Exam  BP 122/78   Pulse 74   Temp 98.5 F (36.9 C) (Oral)   Wt 215 lb (97.5 kg)   LMP 03/16/2016   SpO2 98%   BMI 36.90 kg/m  Wt Readings from Last 3 Encounters:  04/06/16 215 lb (97.5 kg)  06/01/15 211 lb (95.7 kg)  05/10/15 211 lb 9.6 oz (96 kg)    General: Appears her stated age, obese in NAD. Cardiovascular: Normal rate and rhythm. Pulmonary/Chest: Normal effort and positive vesicular breath sounds. No respiratory distress. No wheezes, rales or ronchi noted.  Abdomen: Soft and nontender. Normal bowel sounds. No distention  or masses noted.  Neurological: Alert and oriented.   BMET    Component Value Date/Time   NA 140 11/11/2014 1611   NA 138 06/04/2012 0847   K 4.1 11/11/2014 1611   K 3.7 06/04/2012 0847   CL 104 11/11/2014 1611   CL 109 (H) 06/04/2012 0847   CO2 20 11/11/2014 1611   CO2 25 06/04/2012 0847   GLUCOSE 122 (H) 11/11/2014 1611   GLUCOSE 127 (H) 02/03/2013 1444   GLUCOSE 101 (H) 06/04/2012 0847   BUN 6 11/11/2014 1611   BUN 8 06/04/2012 0847   CREATININE 0.78 11/11/2014 1611   CREATININE 0.91 06/04/2012 0847   CREATININE 0.90 10/10/2011 1610   CALCIUM 8.8 11/11/2014 1611   CALCIUM 8.3 (L) 06/04/2012 0847   GFRNONAA 105 11/11/2014 1611   GFRNONAA >60 06/04/2012 0847   GFRAA 121 11/11/2014 1611   GFRAA >60 06/04/2012 0847    Lipid Panel     Component Value Date/Time   CHOL 133 02/03/2013 1444   TRIG 48.0 02/03/2013 1444   HDL 42.20 02/03/2013 1444   CHOLHDL 3 02/03/2013 1444   VLDL 9.6 02/03/2013 1444   LDLCALC 81 02/03/2013 1444    CBC    Component Value Date/Time   WBC 16.1 (H) 03/24/2015 0635   RBC 4.28 03/24/2015 0635   HGB 10.1 (L) 03/24/2015 0635   HGB 12.7 06/04/2012 0847   HCT 32.6 (L) 03/24/2015 0635   HCT 35.3 11/11/2014 1611   PLT 216 03/24/2015 0635   PLT 279 11/11/2014 1611   MCV 76.2 (L) 03/24/2015 0635   MCV 75 (L) 11/11/2014 1611   MCV 76 (L) 06/04/2012 0847   MCH 23.7 (L) 03/24/2015 0635   MCHC 31.1 (L) 03/24/2015 0635   RDW 14.5 03/24/2015 0635   RDW 15.9 (H) 11/11/2014 1611   RDW 14.4 06/04/2012 0847   LYMPHSABS 2.3 03/23/2015 1830   LYMPHSABS 2.6 11/11/2014 1611   MONOABS 2.3 (H) 03/23/2015 1830   EOSABS 0.0 03/23/2015 1830   EOSABS 0.0 11/11/2014 1611   BASOSABS 0.0 03/23/2015 1830   BASOSABS 0.0 11/11/2014 1611    Hgb A1C Lab Results  Component Value Date   HGBA1C 5.6 09/17/2014       .     Assessment & Plan:   Breastfeeding:  Advised her we do not give medication to dry up breastfeeding Weaning schedule and  instructions given  Fatigue, nausea, food aversions, pelvic cramping and food aversions:  SerumHcG today  Obesity:  If serum HcG negative, will restart Phentermine  Will follow up after labs, return precautions discussed Nicki Reaper, NP

## 2016-04-06 NOTE — Patient Instructions (Signed)
Weaning When you stop breastfeeding, it is called weaning. This can be a natural process that takes place on its own over time. There may be a reason you need to stop breastfeeding before it can happen naturally (such as you may need to go back to work, be away from home on a trip, or you feel it is the right time). If possible, wait until your baby is at least 766 months old. With a little time and preparation, weaning can be a positive experience. When is the best time to stop breastfeeding? There is no right or wrong answer. What is best for you and your child may be different from what is best for other mothers and their children. It is recommended to:  Feed your baby only breast milk for the first 6 months.  Feed your baby both breast milk and solid food for another 6 months.  Continue to breastfeed your baby as long as both you and your child desire after 12 months. How do I start weaning? Wean gradually over several weeks. Some guidelines to follow are:  Start by introducing iron-fortified, solid food in addition to breast milk.  Encourage your baby to try different feeding methods.  Teach your baby to drink from a cup. Try putting pre-pumped (expressed) breast milk in the cup.  If your baby will not use a cup, try offering a bottle.  It may be easier and less confusing for your baby if someone else offers the first cup or bottle feeding.  When cup or bottle feeding is successful, and your baby is getting enough nutrition that way, you can substitute a cup feeding for a breastfeeding session.  You can eventually substitute a cup feeding for another breastfeeding session, especially if your baby will drink something besides your expressed breast milk.  Continue replacing one more breastfeeding session every few days.  Your breasts may feel full and uncomfortable at times during weaning. Express just a small amount of milk for relief. How does breastfeeding stop naturally? Children  may start to wean themselves at about 6 months. This may happen when:  You introduce solid food. Your baby may still prefer to nurse in order to get fluids.  Your baby is better able to drink from a cup. This may prompt your baby to breastfeed less often.  Your baby gradually becomes less interested in breastfeeding as he or she gets used to drinking other fluids.  Your baby slowly starts to drop one breastfeeding session every 2-3 days. When should I get help with weaning? Your health care provider or a lactation consultant can help you during the weaning process. They are good resources to ask which foods are best to introduce first and which fluids you can start substituting for breast milk. They can also help if you are having any problems related to weaning. When should I contact my health care provider? You should contact your health care provider if:  Your baby is not gaining weight.  You baby suddenly stops nursing.  One or both breasts become firm and painful. This information is not intended to replace advice given to you by your health care provider. Make sure you discuss any questions you have with your health care provider. Document Released: 07/18/2004 Document Revised: 08/25/2015 Document Reviewed: 01/13/2013 Elsevier Interactive Patient Education  2017 [employer]

## 2016-04-07 ENCOUNTER — Encounter: Payer: Self-pay | Admitting: Internal Medicine

## 2016-04-07 LAB — HCG, QUANTITATIVE, PREGNANCY

## 2016-04-15 ENCOUNTER — Encounter: Payer: Self-pay | Admitting: Internal Medicine

## 2016-05-03 ENCOUNTER — Encounter: Payer: Self-pay | Admitting: Internal Medicine

## 2016-05-03 MED ORDER — PHENTERMINE HCL 37.5 MG PO CAPS: 37.5000 mg | ORAL_CAPSULE | ORAL | 0 refills | Status: DC

## 2016-05-03 MED ORDER — NORGESTIM-ETH ESTRAD TRIPHASIC 0.18/0.215/0.25 MG-35 MCG PO TABS: 1.0000 | ORAL_TABLET | Freq: Every day | ORAL | 11 refills | Status: DC

## 2016-05-20 ENCOUNTER — Other Ambulatory Visit: Payer: Self-pay | Admitting: Obstetrics and Gynecology

## 2016-05-28 ENCOUNTER — Encounter: Payer: Self-pay | Admitting: Internal Medicine

## 2016-06-01 ENCOUNTER — Ambulatory Visit: Payer: 59 | Admitting: Internal Medicine

## 2016-06-01 ENCOUNTER — Telehealth: Payer: Self-pay | Admitting: Internal Medicine

## 2016-06-01 NOTE — Telephone Encounter (Signed)
Patient called to cancel her appointment for today.  [facility] at 5:15.  If patient can't be seen at this time, she wants to switch to another provider.

## 2016-06-01 NOTE — Telephone Encounter (Signed)
I would have willingly seen her to accommodate her schedule, but I am tired of her threatening me about switching providers. I will no longer see her.

## 2016-06-05 ENCOUNTER — Other Ambulatory Visit: Payer: Self-pay | Admitting: Internal Medicine

## 2016-06-05 NOTE — Telephone Encounter (Signed)
Brittany White, Brittany White and I spoke to the patient today.  She was upset that no one called her to advise that [name] will not see her on Wednesday.  I apologized for the breakdown in communication as it was not clear to me or Bonita QuinLinda that we were supposed to call her.  I explained that [name] no longer wishes to see her because she continues to be rude and threaten to switch providers which demonstrates lack of confidence in the provider - patient relationship.  I explained that she would benefit from seeing a provider who has extended hours.

## 2016-06-27 ENCOUNTER — Encounter: Payer: Self-pay | Admitting: Internal Medicine

## 2016-06-28 ENCOUNTER — Telehealth: Payer: Self-pay | Admitting: Internal Medicine

## 2016-06-28 NOTE — Telephone Encounter (Signed)
[facility] by Nicki [facility]ina Baity NP-C , effective June 27, 2016. Dismissal letter sent out by certified / registered mail. DAJ

## 2016-07-10 NOTE — Telephone Encounter (Signed)
Received signed domestic return receipt verifying delivery of certified letter on July 07, 2016. Article number [id number] DAJ

## 2016-11-20 ENCOUNTER — Other Ambulatory Visit: Payer: Self-pay | Admitting: Obstetrics and Gynecology

## 2017-01-04 DIAGNOSIS — E559 Vitamin D deficiency, unspecified: Secondary | ICD-10-CM | POA: Diagnosis not present

## 2017-01-04 DIAGNOSIS — E7800 Pure hypercholesterolemia, unspecified: Secondary | ICD-10-CM | POA: Diagnosis not present

## 2017-01-04 DIAGNOSIS — R5383 Other fatigue: Secondary | ICD-10-CM | POA: Diagnosis not present

## 2017-01-04 DIAGNOSIS — M545 Low back pain: Secondary | ICD-10-CM | POA: Diagnosis not present

## 2017-01-04 DIAGNOSIS — R739 Hyperglycemia, unspecified: Secondary | ICD-10-CM | POA: Diagnosis not present

## 2017-02-03 IMAGING — US US MFM FETAL BPP W/O NON-STRESS
1 series · 12 of 12 positions shown · non-contrast
Comparison: none

[Series 1: us mfm fetal bpp w/o non-stress · 0.25mm/px · 12 acquisitions, 12 frames shown]
[im 1/12]
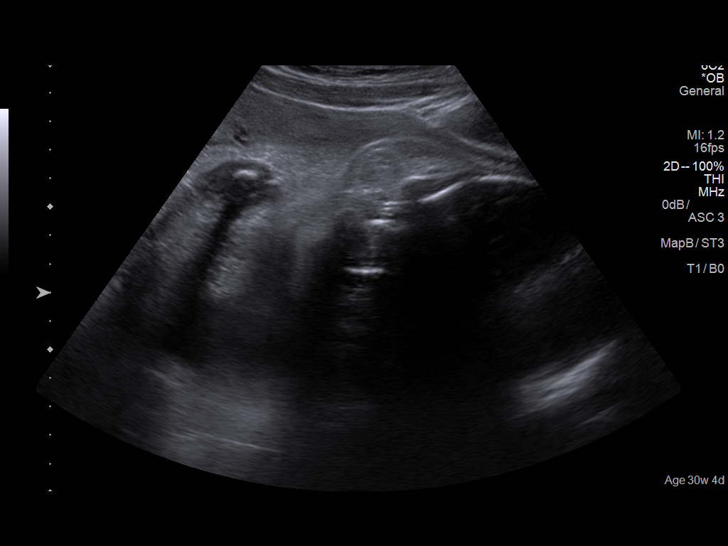
[im 2/12]
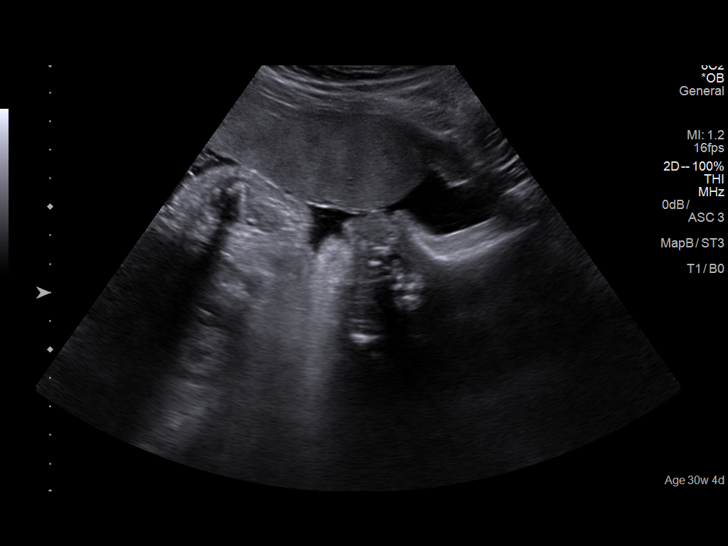
[im 3/12]
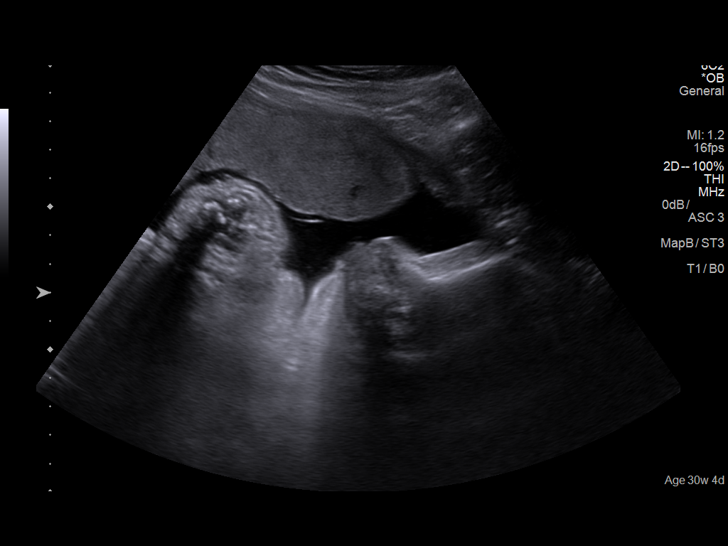
[im 4/12]
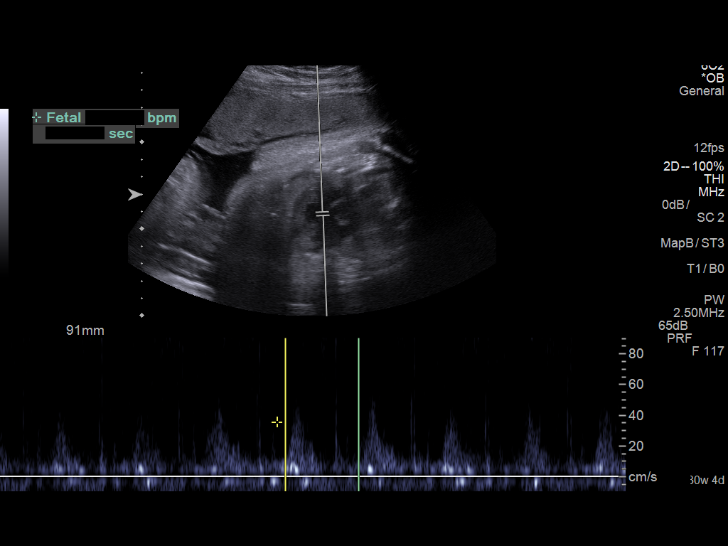
[im 5/12]
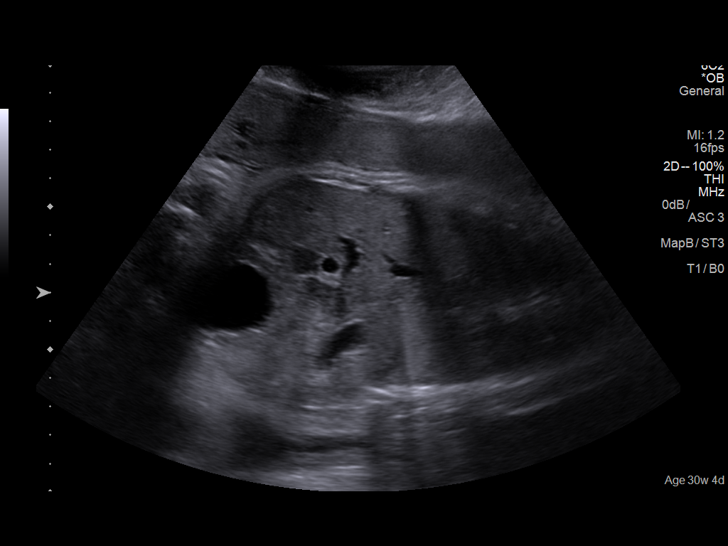
[im 6/12]
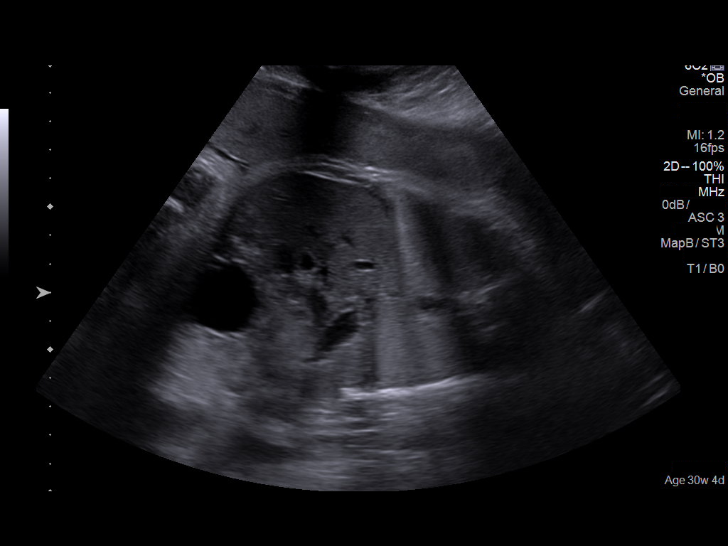
[im 7/12]
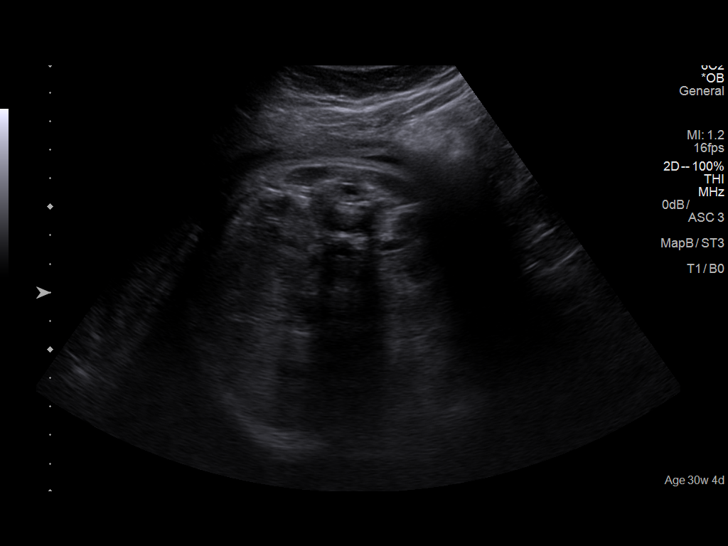
[im 8/12]
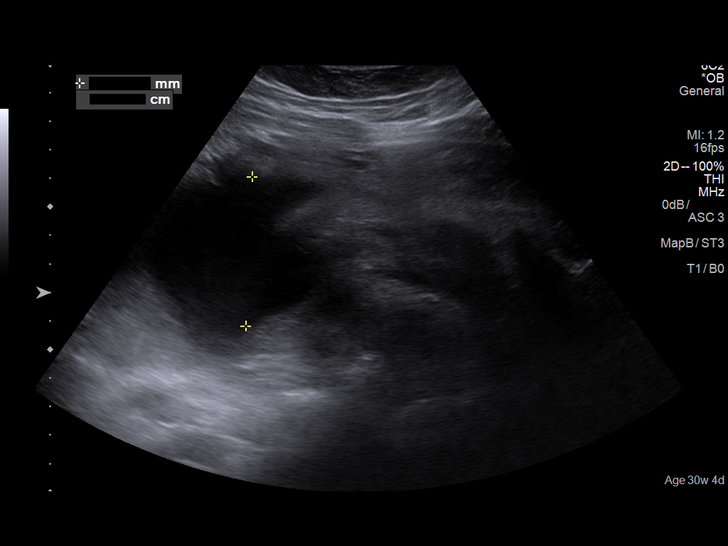
[im 9/12]
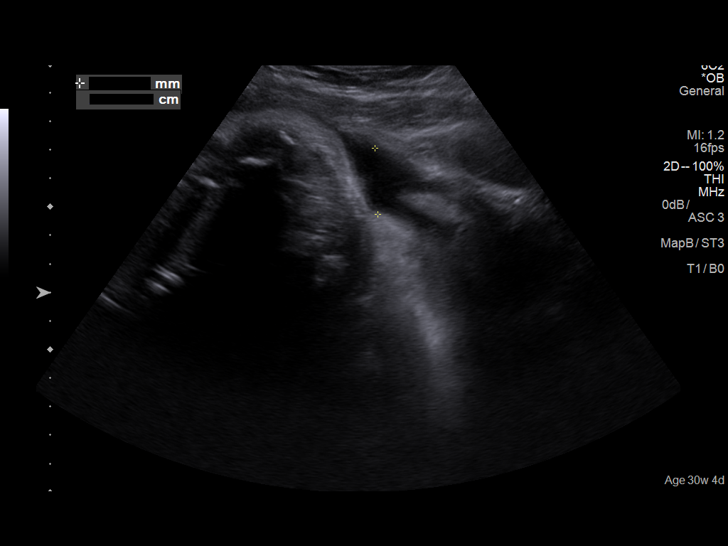
[im 10/12]
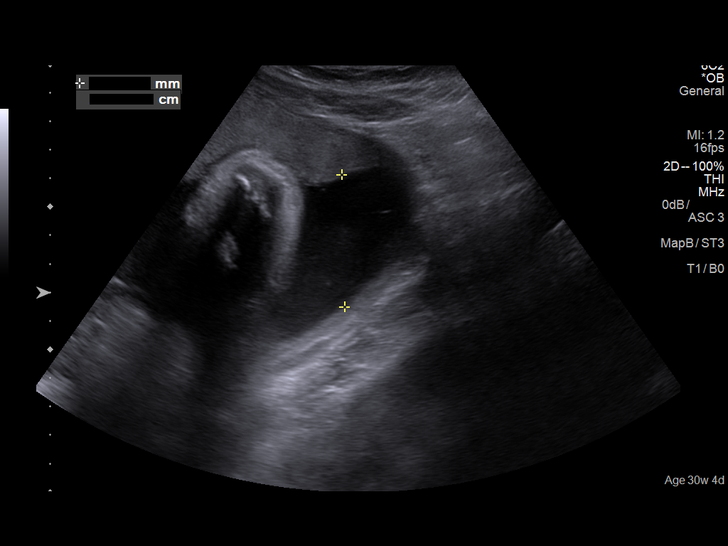
[im 11/12]
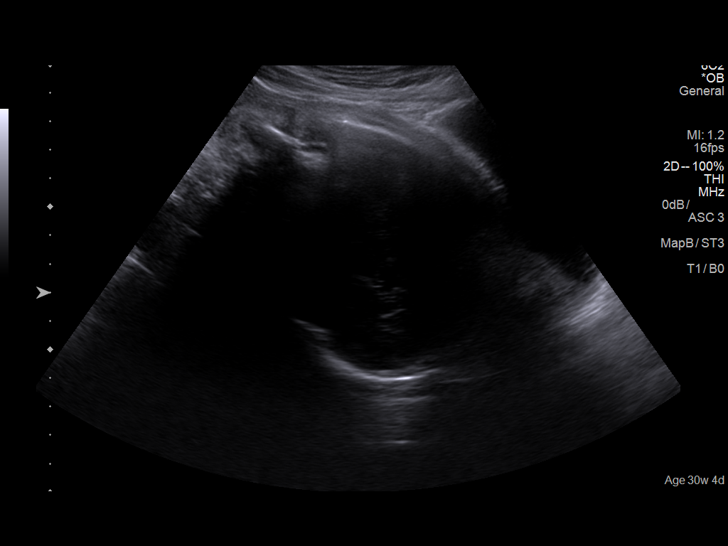
[im 12/12]
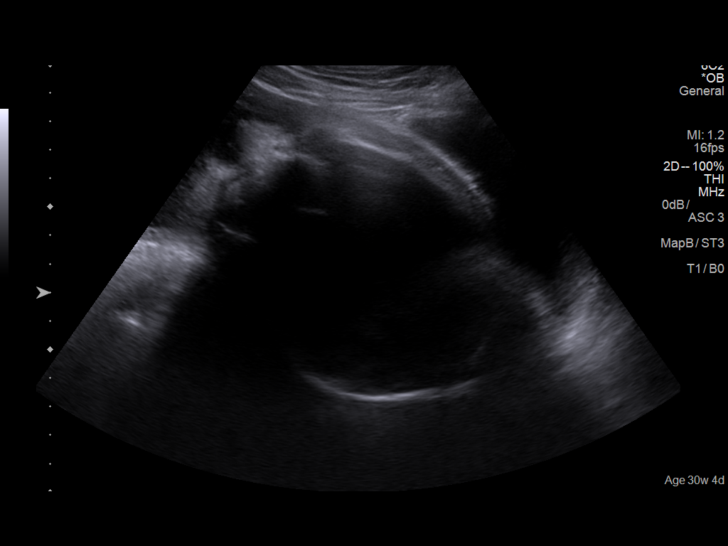

[12 of 12 positions shown; findings below may reference images not displayed]

Canned report from images found in remote index.

Refer to host system for actual result text.

## 2017-02-20 IMAGING — US US MFM FETAL BPP W/O NON-STRESS
1 series · 10 of 10 positions shown · non-contrast
Comparison: none

[Series 1: us mfm fetal bpp w/o non-stress · 0.23mm/px · 10 acquisitions, 10 frames shown]
[im 1/10]
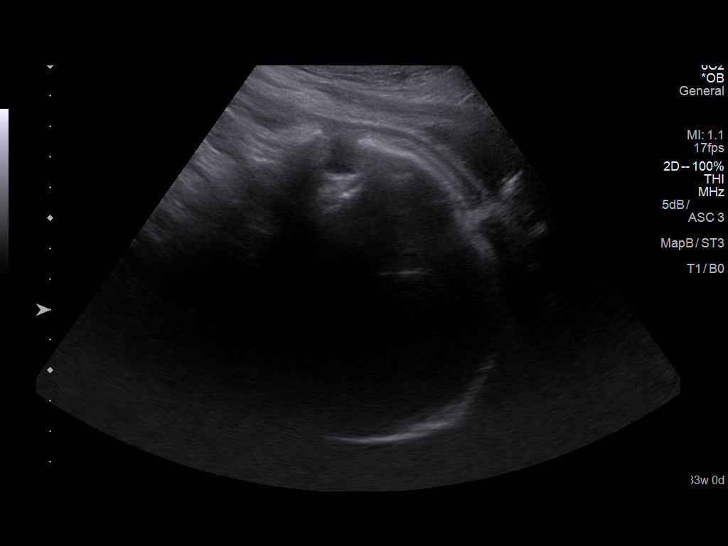
[im 2/10]
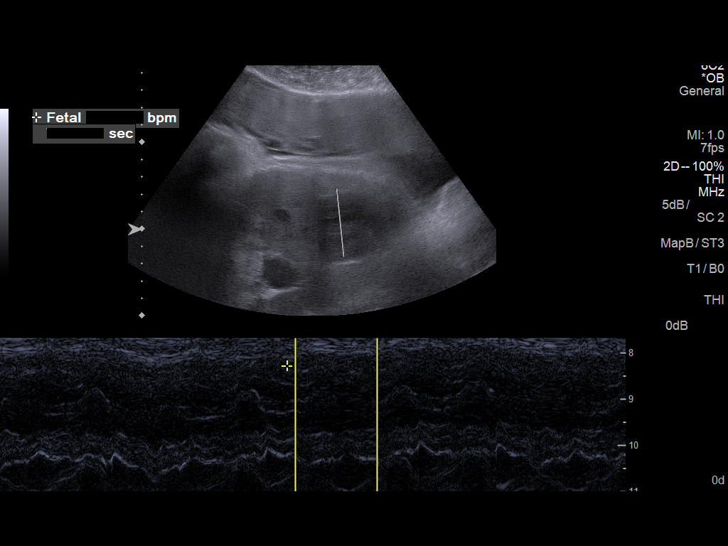
[im 3/10]
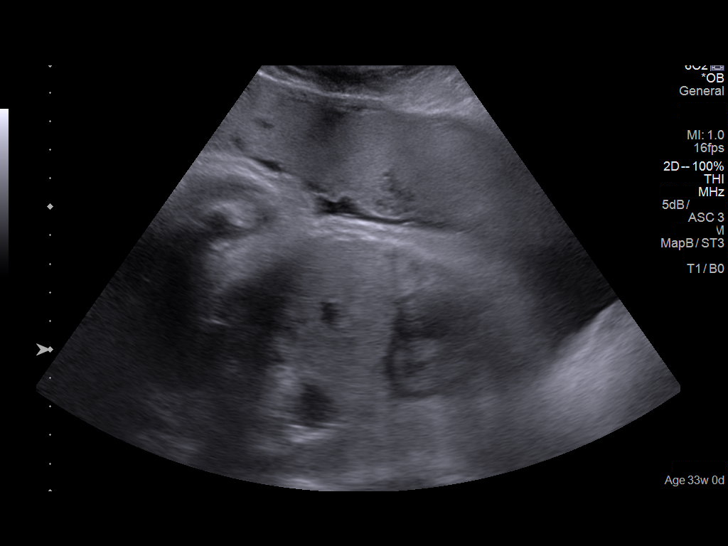
[im 4/10]
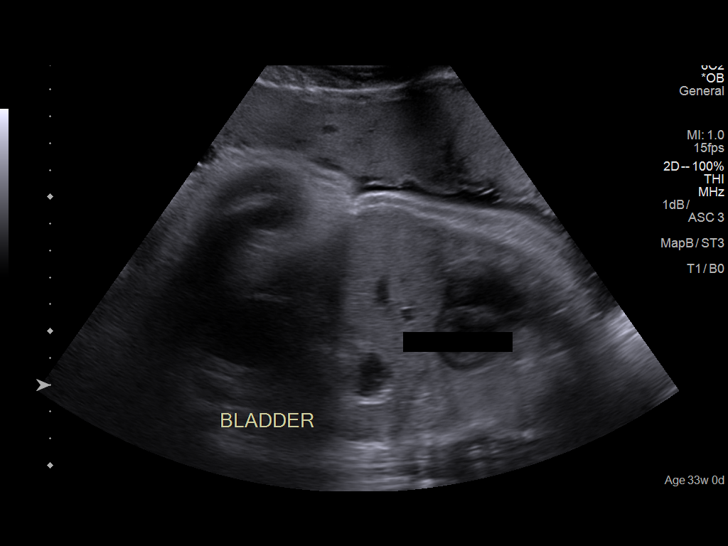
[im 5/10]
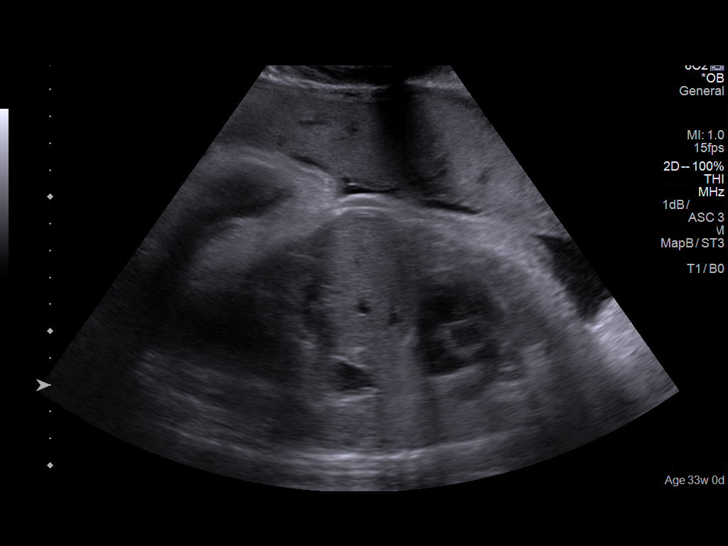
[im 6/10]
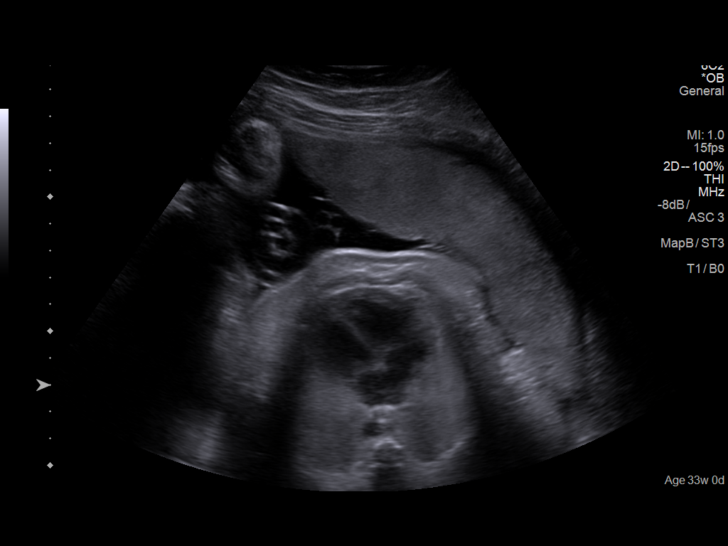
[im 7/10]
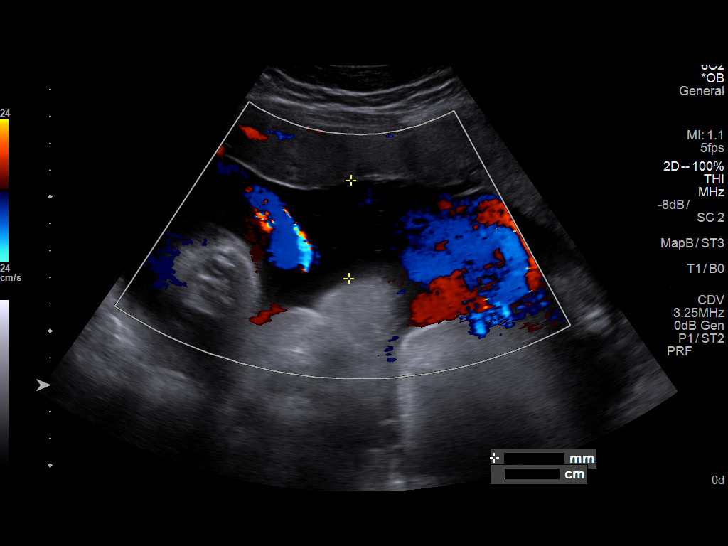
[im 8/10]
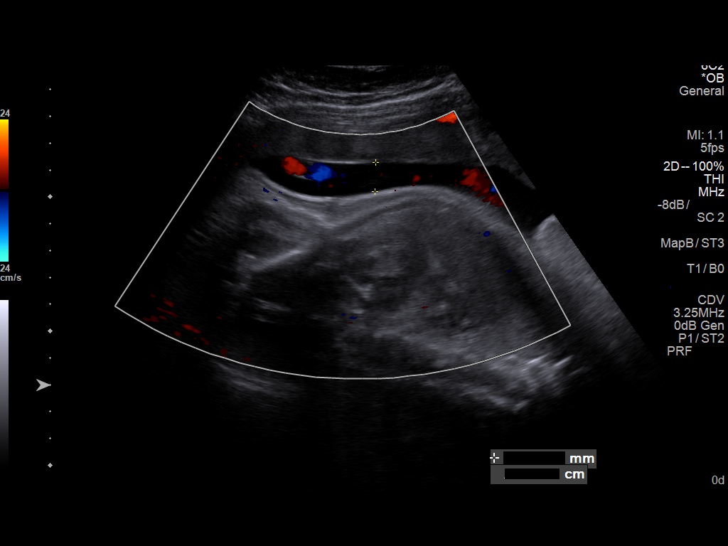
[im 9/10]
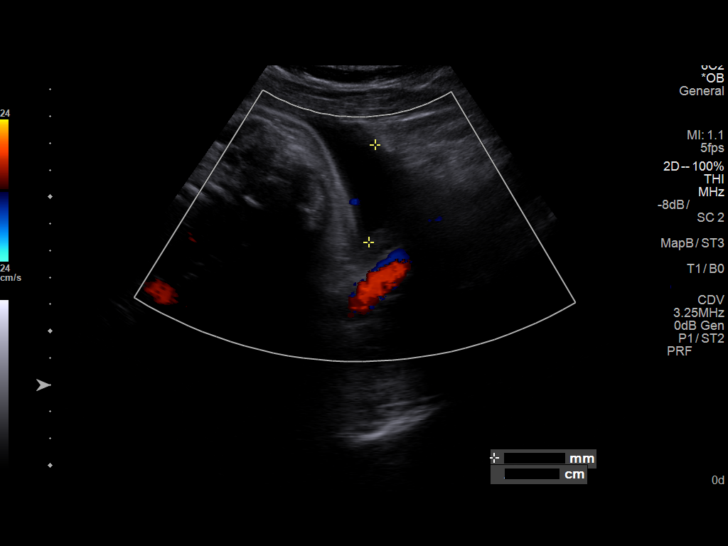
[im 10/10]
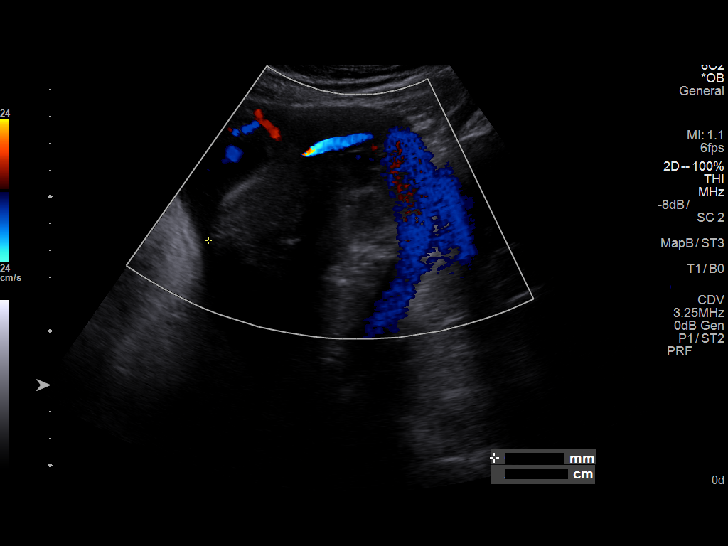

[10 of 10 positions shown; findings below may reference images not displayed]

Canned report from images found in remote index.

Refer to host system for actual result text.

## 2017-03-22 DIAGNOSIS — K219 Gastro-esophageal reflux disease without esophagitis: Secondary | ICD-10-CM | POA: Diagnosis not present

## 2017-03-22 DIAGNOSIS — R079 Chest pain, unspecified: Secondary | ICD-10-CM | POA: Diagnosis not present

## 2017-03-22 DIAGNOSIS — E119 Type 2 diabetes mellitus without complications: Secondary | ICD-10-CM | POA: Diagnosis not present

## 2017-03-27 DIAGNOSIS — R5381 Other malaise: Secondary | ICD-10-CM | POA: Diagnosis not present

## 2017-03-27 DIAGNOSIS — E559 Vitamin D deficiency, unspecified: Secondary | ICD-10-CM | POA: Diagnosis not present

## 2017-03-27 DIAGNOSIS — E7800 Pure hypercholesterolemia, unspecified: Secondary | ICD-10-CM | POA: Diagnosis not present

## 2017-03-27 DIAGNOSIS — I340 Nonrheumatic mitral (valve) insufficiency: Secondary | ICD-10-CM | POA: Diagnosis not present

## 2017-03-27 DIAGNOSIS — R079 Chest pain, unspecified: Secondary | ICD-10-CM | POA: Diagnosis not present

## 2017-03-27 DIAGNOSIS — E119 Type 2 diabetes mellitus without complications: Secondary | ICD-10-CM | POA: Diagnosis not present

## 2017-03-27 DIAGNOSIS — R1010 Upper abdominal pain, unspecified: Secondary | ICD-10-CM | POA: Diagnosis not present

## 2017-04-08 DIAGNOSIS — R5383 Other fatigue: Secondary | ICD-10-CM | POA: Diagnosis not present

## 2017-04-08 DIAGNOSIS — M545 Low back pain: Secondary | ICD-10-CM | POA: Diagnosis not present

## 2017-04-08 DIAGNOSIS — N926 Irregular menstruation, unspecified: Secondary | ICD-10-CM | POA: Diagnosis not present

## 2017-04-08 DIAGNOSIS — G43909 Migraine, unspecified, not intractable, without status migrainosus: Secondary | ICD-10-CM | POA: Diagnosis not present

## 2017-04-09 ENCOUNTER — Encounter: Payer: Self-pay | Admitting: Obstetrics and Gynecology

## 2017-04-10 ENCOUNTER — Encounter: Payer: Self-pay | Admitting: Obstetrics and Gynecology

## 2017-04-10 ENCOUNTER — Ambulatory Visit (INDEPENDENT_AMBULATORY_CARE_PROVIDER_SITE_OTHER): Payer: 59 | Admitting: Obstetrics and Gynecology

## 2017-04-10 VITALS — BP 144/94 | HR 84 | Wt 212.4 lb

## 2017-04-10 DIAGNOSIS — N938 Other specified abnormal uterine and vaginal bleeding: Secondary | ICD-10-CM | POA: Diagnosis not present

## 2017-04-10 NOTE — Progress Notes (Signed)
Patient reports she has had abnormal bleeding for the last 11 days. She reports that the bleeding is not consistent. She will have bleeding for 1 day, then stop the next. She reports that her PCP did a blood HCG test and it was negative.

## 2017-04-10 NOTE — Progress Notes (Signed)
    GYNECOLOGY PROGRESS NOTE  Subjective:    Patient ID: Brittany White, female    DOB: [birth year], 32 y.o.   MRN: [id number]  HPI  Patient is a 32 y.o. 541P0101 female with a PMH of Type II DM who presents for complaints of abnormal menses.  Patient states that she has been been bleeding off and on for 11 days.  Bleeding is mostly dark brown, light flow.  She is currently on combined OCPs (currently on 3rd week of current pack). Denies missing any pills.  She reports that she went to her PCP 2 days ago where she had a pregnancy test (blood test) which was negative. Denies any associated cramping.  Denies any prior episodes of irregular bleeding.   The following portions of the patient's history were reviewed and updated as appropriate: allergies, current medications, past family history, past medical history, past social history, past surgical history and problem list.  Review of Systems Pertinent items noted in HPI and remainder of comprehensive ROS otherwise negative.   Objective:   Blood pressure (!) 144/94, pulse 84, weight 212 lb 6.4 oz (96.3 kg), currently breastfeeding.  LMP 03/13/2017.  General appearance: alert and no distress Abdomen: soft, non-tender; bowel sounds normal; no masses,  no organomegaly Pelvic: external genitalia normal, rectovaginal septum normal.  Vagina moderate amount of dark brown discharge (slightly curdy).  Cervix normal appearing, no lesions and no motion tenderness.  Uterus mobile, nontender, normal shape and size.  Adnexae non-palpable, nontender bilaterally.    Labs:  Results for orders placed or performed in visit on 04/06/16  hCG, quantitative, pregnancy  Result Value Ref Range   hCG, Beta Chain, Quant, S <2.0 mIU/mL   Lab Results  Component Value Date   TSH 1.010 09/17/2014     Assessment:   Dysfunctional uterine bleeding Type II DM  Plan:   - Discussed possible reasons for abnormal bleeding on OCPs, including currently uncontrolled  diabetes, stressors (although patient denies any recent stressors), infections, current type of OCPS (patient currently on triphasic pills and has been on these for a while, may benefit from switching to monophasic type), other endocrine dysfunction (i.e. Thyroid), or idiopathic. Advised patient on working to get diabetes better controlled.  Will monitor through next cycle to assess to see if symptoms resolve.  Otherwise, can prescribe monophasic OCPs and do further assessment such as rechecking thyroid levels.  Nuswab performed today to r/o infections. Will notify by phone of results.  All questions answered.  - To f/u if symptoms worsen or fail to improve after next cycle.    A total of 15 minutes were spent face-to-face with the patient during this encounter and over half of that time dealt with counseling and coordination of care.   Brittany White, Brittany Lukasik, MD [facility]

## 2017-04-11 ENCOUNTER — Encounter: Payer: Self-pay | Admitting: Obstetrics and Gynecology

## 2017-04-11 NOTE — Patient Instructions (Signed)
Dysfunctional Uterine Bleeding °Dysfunctional uterine bleeding is abnormal bleeding from the uterus. Dysfunctional uterine bleeding includes: °· A period that comes earlier or later than usual. °· A period that is lighter, heavier, or has blood clots. °· Bleeding between periods. °· Skipping one or more periods. °· Bleeding after sexual intercourse. °· Bleeding after menopause. ° °Follow these instructions at home: °Pay attention to any changes in your symptoms. Follow these instructions to help with your condition: °Eating and drinking °· Eat well-balanced meals. Include foods that are high in iron, such as liver, meat, shellfish, green leafy vegetables, and eggs. °· If you become constipated: °? Drink plenty of water. °? Eat fruits and vegetables that are high in water and fiber, such as spinach, carrots, raspberries, apples, and mango. °Medicines °· Take over-the-counter and prescription medicines only as told by your health care provider. °· Do not change medicines without talking with your health care provider. °· Aspirin or medicines that contain aspirin may make the bleeding worse. Do not take those medicines: °? During the week before your period. °? During your period. °· If you were prescribed iron pills, take them as told by your health care provider. Iron pills help to replace iron that your body loses because of this condition. °Activity °· If you need to change your sanitary pad or tampon more than one time every 2 hours: °? Lie in bed with your feet raised (elevated). °? Place a cold pack on your lower abdomen. °? Rest as much as possible until the bleeding stops or slows down. °· Do not try to lose weight until the bleeding has stopped and your blood iron level is back to normal. °Other Instructions °· For two months, write down: °? When your period starts. °? When your period ends. °? When any abnormal bleeding occurs. °? What problems you notice. °· Keep all follow up visits as told by your health  care provider. This is important. °Contact a health care provider if: °· You get light-headed or weak. °· You have nausea and vomiting. °· You cannot eat or drink without vomiting. °· You feel dizzy or have diarrhea while you are taking medicines. °· You are taking birth control pills or hormones, and you want to change them or stop taking them. °Get help right away if: °· You develop a fever or chills. °· You need to change your sanitary pad or tampon more than one time per hour. °· Your bleeding becomes heavier, or your flow contains clots more often. °· You develop pain in your abdomen. °· You lose consciousness. °· You develop a rash. °This information is not intended to replace advice given to you by your health care provider. Make sure you discuss any questions you have with your health care provider. °Document Released: 03/16/2000 Document Revised: 08/25/2015 Document Reviewed: 06/14/2014 °Elsevier Interactive Patient Education © 2018 [employer] ° °

## 2017-04-13 ENCOUNTER — Encounter: Payer: Self-pay | Admitting: Obstetrics and Gynecology

## 2017-04-13 LAB — NUSWAB VAGINITIS PLUS (VG+)
CANDIDA GLABRATA, NAA: NEGATIVE
CHLAMYDIA TRACHOMATIS, NAA: NEGATIVE
Candida albicans, NAA: NEGATIVE
Neisseria gonorrhoeae, NAA: NEGATIVE
TRICH VAG BY NAA: NEGATIVE

## 2017-04-14 ENCOUNTER — Encounter: Payer: Self-pay | Admitting: Obstetrics and Gynecology

## 2017-04-15 ENCOUNTER — Encounter: Payer: Self-pay | Admitting: Obstetrics and Gynecology

## 2017-04-16 ENCOUNTER — Telehealth: Payer: Self-pay

## 2017-04-16 MED ORDER — NORGESTIMATE-ETH ESTRADIOL 0.25-35 MG-MCG PO TABS: 1.0000 | ORAL_TABLET | Freq: Every day | ORAL | 1 refills | Status: DC

## 2017-04-16 NOTE — Telephone Encounter (Signed)
Please change patient from Tri-sprintec to Sprintec.  Also, have her do an OCP taper.  Reiterate that also as long as her blood sugars are significantly abnormal that this can have an effect on he periods.

## 2017-04-16 NOTE — Telephone Encounter (Signed)
Pt aware sprintec erx. Advised on ocp taper. Gave pt 6 month supply. Made an appt to f/u with ac in 6 months.

## 2017-05-10 DIAGNOSIS — M545 Low back pain: Secondary | ICD-10-CM | POA: Diagnosis not present

## 2017-05-10 DIAGNOSIS — E559 Vitamin D deficiency, unspecified: Secondary | ICD-10-CM | POA: Diagnosis not present

## 2017-05-10 DIAGNOSIS — R5383 Other fatigue: Secondary | ICD-10-CM | POA: Diagnosis not present

## 2017-05-10 DIAGNOSIS — G43909 Migraine, unspecified, not intractable, without status migrainosus: Secondary | ICD-10-CM | POA: Diagnosis not present

## 2017-05-10 DIAGNOSIS — E7800 Pure hypercholesterolemia, unspecified: Secondary | ICD-10-CM | POA: Diagnosis not present

## 2017-05-10 DIAGNOSIS — E119 Type 2 diabetes mellitus without complications: Secondary | ICD-10-CM | POA: Diagnosis not present

## 2017-05-16 ENCOUNTER — Encounter: Payer: Self-pay | Admitting: Obstetrics and Gynecology

## 2017-06-21 DIAGNOSIS — R5383 Other fatigue: Secondary | ICD-10-CM | POA: Diagnosis not present

## 2017-06-21 DIAGNOSIS — Z7689 Persons encountering health services in other specified circumstances: Secondary | ICD-10-CM | POA: Diagnosis not present

## 2017-06-21 DIAGNOSIS — E119 Type 2 diabetes mellitus without complications: Secondary | ICD-10-CM | POA: Diagnosis not present

## 2017-06-21 DIAGNOSIS — E7800 Pure hypercholesterolemia, unspecified: Secondary | ICD-10-CM | POA: Diagnosis not present

## 2017-06-21 DIAGNOSIS — M545 Low back pain: Secondary | ICD-10-CM | POA: Diagnosis not present

## 2017-06-21 DIAGNOSIS — E559 Vitamin D deficiency, unspecified: Secondary | ICD-10-CM | POA: Diagnosis not present

## 2017-06-26 ENCOUNTER — Encounter: Payer: Self-pay | Admitting: Obstetrics and Gynecology

## 2017-06-27 ENCOUNTER — Other Ambulatory Visit: Payer: Self-pay

## 2017-06-27 MED ORDER — NORGESTIMATE-ETH ESTRADIOL 0.25-35 MG-MCG PO TABS: 1.0000 | ORAL_TABLET | Freq: Every day | ORAL | 4 refills | Status: DC

## 2017-07-21 ENCOUNTER — Encounter: Payer: Self-pay | Admitting: Obstetrics and Gynecology

## 2017-07-25 ENCOUNTER — Encounter: Payer: Self-pay | Admitting: Obstetrics and Gynecology

## 2017-07-25 ENCOUNTER — Telehealth: Payer: Self-pay | Admitting: Obstetrics and Gynecology

## 2017-07-25 ENCOUNTER — Ambulatory Visit (INDEPENDENT_AMBULATORY_CARE_PROVIDER_SITE_OTHER): Payer: 59 | Admitting: Obstetrics and Gynecology

## 2017-07-25 VITALS — BP 139/79 | HR 101 | Ht 64.0 in | Wt 210.6 lb

## 2017-07-25 DIAGNOSIS — B373 Candidiasis of vulva and vagina: Secondary | ICD-10-CM | POA: Diagnosis not present

## 2017-07-25 DIAGNOSIS — E119 Type 2 diabetes mellitus without complications: Secondary | ICD-10-CM

## 2017-07-25 DIAGNOSIS — B3731 Acute candidiasis of vulva and vagina: Secondary | ICD-10-CM

## 2017-07-25 MED ORDER — FLUCONAZOLE 150 MG PO TABS: 150.0000 mg | ORAL_TABLET | ORAL | 3 refills | Status: DC

## 2017-07-25 MED ORDER — TERCONAZOLE 0.4 % VA CREA: 1.0000 | TOPICAL_CREAM | Freq: Every day | VAGINAL | 1 refills | Status: AC

## 2017-07-25 MED ORDER — HYLAFEM VA SUPP: 1.0000 | Freq: Every day | VAGINAL | 0 refills | Status: DC

## 2017-07-25 NOTE — Progress Notes (Signed)
    GYNECOLOGY PROGRESS NOTE  Subjective:    Patient ID: Brittany White, female    DOB: 07-20-1985, 32 y.o.   MRN: [id number]  HPI  Patient is a 32 y.o. 131P0101 female with PMH of Type II DM who presents for complaints of vaginal discharge.  Patient notes that she has recently been treated for reoccurring yeast infections with rounds of Diflucan and OTC Monistat by her PCP, but still noting symptoms. Patient notes that lately, the discharge has not been coming from the vagina per se, but more so from the clitoral hood. Notes that the area has become very tender and swollen due to her constant wiping of the area to keep it clean. Patient notes that she is working to get her diabetes better controlled.    The following portions of the patient's history were reviewed and updated as appropriate: allergies, current medications, past family history, past medical history, past social history, past surgical history and problem list.  Review of Systems Pertinent items noted in HPI and remainder of comprehensive ROS otherwise negative.   Objective:   Blood pressure 139/79, pulse (!) 101, height 5\' 4"  (1.626 m), weight 210 lb 9.6 oz (95.5 kg), last menstrual period 06/28/2017, currently breastfeeding. General appearance: alert and no distress Abdomen: soft, non-tender; bowel sounds normal; no masses,  no organomegaly Pelvic: external genitalia normal, except small amount of clumpy white discharge present underneath clitoral hood. Rectovaginal septum normal.  Vagina with small amount of thick clumpy white discharge.  Cervix normal appearing, no lesions and no motion tenderness.  Bimanual exam not performed.    Labs:  Microscopic wet-mount exam shows hyphae. No trichomonads, few clue cells and normal epithelia cells.  KOH done.   Assessment:   Recurrent yeast vaginitis Type II DM  Plan:   - Discussed management options for management of recurrent yeast vaginitis. Will treat with boric acid  suppositories x 1 week, followed by weekly maintenance therapy with Diflucan.  Will also prescribe Terazol cream to apply externally to clitoral hood until symptoms resolve.  - Continue to encourage tight control of diabetes.  Diabetes managed by Endocrinologist.  - Patient states that she was supposed to have a f/u appointment in 1 month to reassess if her current birth control is working well for her (previously changed due to breakthrough bleeding). Patient notes that current birth control is doing well. Can cancel next f/u appointment.  - RTC if symptoms worsen or fail to improve.    Hildred Laserherry, Artice Bergerson, MD [facility]

## 2017-07-25 NOTE — Telephone Encounter (Signed)
The patient called and stated that she would like to speak with Dr. Valentino Saxonherry or her nurse in regards to her medication Homeopathic Products (HYLAFEM) SUPP not being covered by her insurance. Please advise.

## 2017-07-25 NOTE — Progress Notes (Signed)
Pt is having discharge coming from the clitoris area of her vaginal x 1 week. Pt cleans the area and noticed that it becomes swollen, red, and itchy afterwards. No change in soap or laundry detergent.

## 2017-07-25 NOTE — Telephone Encounter (Signed)
Please inform patient that as previously disucussed at her visit, the Hylafem may be unavailable at some pharmacies. She has the option of creating the boric acid capsules herself, and we can send in the ingredients, or she can use the Terazole cream that was prescribed for 7 days, along with weekly Diflucan.

## 2017-07-26 ENCOUNTER — Other Ambulatory Visit: Payer: Self-pay

## 2017-07-26 NOTE — Telephone Encounter (Signed)
Pt was called back and she stated that she wanted the ingredients sent in.

## 2017-07-28 ENCOUNTER — Encounter: Payer: Self-pay | Admitting: Obstetrics and Gynecology

## 2017-07-31 LAB — NUSWAB VAGINITIS (VG)
Candida albicans, NAA: POSITIVE — AB
Candida glabrata, NAA: NEGATIVE
TRICH VAG BY NAA: NEGATIVE

## 2017-08-02 DIAGNOSIS — E559 Vitamin D deficiency, unspecified: Secondary | ICD-10-CM | POA: Diagnosis not present

## 2017-08-02 DIAGNOSIS — E119 Type 2 diabetes mellitus without complications: Secondary | ICD-10-CM | POA: Diagnosis not present

## 2017-08-02 DIAGNOSIS — R5383 Other fatigue: Secondary | ICD-10-CM | POA: Diagnosis not present

## 2017-08-02 DIAGNOSIS — M545 Low back pain: Secondary | ICD-10-CM | POA: Diagnosis not present

## 2017-08-02 DIAGNOSIS — E7800 Pure hypercholesterolemia, unspecified: Secondary | ICD-10-CM | POA: Diagnosis not present

## 2017-08-02 DIAGNOSIS — G43909 Migraine, unspecified, not intractable, without status migrainosus: Secondary | ICD-10-CM | POA: Diagnosis not present

## 2017-10-10 ENCOUNTER — Encounter: Payer: 59 | Admitting: Obstetrics and Gynecology

## 2018-04-09 ENCOUNTER — Other Ambulatory Visit: Payer: Self-pay

## 2018-04-11 DIAGNOSIS — E7800 Pure hypercholesterolemia, unspecified: Secondary | ICD-10-CM | POA: Diagnosis not present

## 2018-04-11 DIAGNOSIS — E559 Vitamin D deficiency, unspecified: Secondary | ICD-10-CM | POA: Diagnosis not present

## 2018-04-11 DIAGNOSIS — E669 Obesity, unspecified: Secondary | ICD-10-CM | POA: Diagnosis not present

## 2018-04-11 DIAGNOSIS — R5383 Other fatigue: Secondary | ICD-10-CM | POA: Diagnosis not present

## 2018-04-11 DIAGNOSIS — E119 Type 2 diabetes mellitus without complications: Secondary | ICD-10-CM | POA: Diagnosis not present

## 2018-04-11 MED ORDER — HYLAFEM VA SUPP: 1.0000 | Freq: Every day | VAGINAL | 0 refills | Status: DC

## 2018-04-21 ENCOUNTER — Encounter: Payer: Self-pay | Admitting: Podiatry

## 2018-04-21 ENCOUNTER — Ambulatory Visit (INDEPENDENT_AMBULATORY_CARE_PROVIDER_SITE_OTHER): Payer: 59 | Admitting: Podiatry

## 2018-04-21 VITALS — BP 129/78 | HR 97

## 2018-04-21 DIAGNOSIS — L603 Nail dystrophy: Secondary | ICD-10-CM

## 2018-04-21 DIAGNOSIS — E109 Type 1 diabetes mellitus without complications: Secondary | ICD-10-CM

## 2018-04-21 NOTE — Progress Notes (Signed)
This patient presents to the office with chief complaint of long thick nails and diabetic feet.  This patient  says there  is  no pain and discomfort in their feet.  This patient says there are long thick painful nails.  These nails are painful walking and wearing shoes.  Patient has no history of infection or drainage from both feet.  Patient is unable to  self treat his own nails . This patient presents  to the office today for treatment of the  long nails and a foot evaluation due to history of  Diabetes. Patient also says she has cracking heels.  General Appearance  Alert, conversant and in no acute stress.  Vascular  Dorsalis pedis and posterior tibial  pulses are palpable  bilaterally.  Capillary return is within normal limits  bilaterally. Temperature is within normal limits  bilaterally.  Neurologic  Senn-Weinstein monofilament wire test within normal limits  bilaterally. Muscle power within normal limits bilaterally.  Nails Nail dystrophy with incurvation lateral borders  Hallux  B/L  Orthopedic  No limitations of motion of motion feet .  No crepitus or effusions noted.  No bony pathology or digital deformities noted. Hallux malleus left.  Skin  normotropic skin with no porokeratosis noted bilaterally.  No signs of infections or ulcers noted.  Heel callus.   Onychomycosis  Diabetes with no foot complications  IE  Discussed nail surgery for incurvated nails hallux  B/L.  A diabetic foot exam was performed and there is no evidence of any vascular or neurologic pathology.  Told to use vaseline and chapstick as the first line of treatment for callus heels. RTC 3 months.   Helane Gunther DPM

## 2018-04-24 ENCOUNTER — Ambulatory Visit: Payer: Self-pay | Admitting: Podiatry

## 2018-04-29 DIAGNOSIS — N760 Acute vaginitis: Secondary | ICD-10-CM | POA: Diagnosis not present

## 2018-04-29 DIAGNOSIS — M545 Low back pain: Secondary | ICD-10-CM | POA: Diagnosis not present

## 2018-04-29 DIAGNOSIS — G43909 Migraine, unspecified, not intractable, without status migrainosus: Secondary | ICD-10-CM | POA: Diagnosis not present

## 2018-05-16 DIAGNOSIS — M7600 Gluteal tendinitis, unspecified hip: Secondary | ICD-10-CM | POA: Diagnosis not present

## 2018-05-16 DIAGNOSIS — M899 Disorder of bone, unspecified: Secondary | ICD-10-CM | POA: Diagnosis not present

## 2018-05-16 DIAGNOSIS — Z0000 Encounter for general adult medical examination without abnormal findings: Secondary | ICD-10-CM | POA: Diagnosis not present

## 2018-05-16 DIAGNOSIS — N760 Acute vaginitis: Secondary | ICD-10-CM | POA: Diagnosis not present

## 2019-06-17 ENCOUNTER — Ambulatory Visit (INDEPENDENT_AMBULATORY_CARE_PROVIDER_SITE_OTHER): Payer: BC Managed Care – PPO | Admitting: Podiatry

## 2019-06-17 ENCOUNTER — Encounter: Payer: Self-pay | Admitting: Podiatry

## 2019-06-17 ENCOUNTER — Other Ambulatory Visit: Payer: Self-pay

## 2019-06-17 VITALS — Temp 98.6°F

## 2019-06-17 DIAGNOSIS — L600 Ingrowing nail: Secondary | ICD-10-CM

## 2019-06-17 MED ORDER — NEOMYCIN-POLYMYXIN-HC 1 % OT SOLN: OTIC | 1 refills | Status: DC

## 2019-06-17 NOTE — Progress Notes (Signed)
Subjective:  Patient ID: Brittany White, female    DOB: Mar 09, 1986,  MRN: [id number] HPI Chief Complaint  Patient presents with  . Toe Pain    Hallux left - lateral border, tender x few weeks, swollen, tried cutting it out    34 y.o. female presents with the above complaint.   ROS: Denies fever chills nausea vomiting muscle aches pains calf pain back pain chest pain shortness of breath.  Past Medical History:  Diagnosis Date  . Anxiety   . Chronic hypertension complicating or reason for care during pregnancy 10/14/2014   controlled by lifestyle modification, on no meds  . Diabetes mellitus without complication (HCC)   . Headache   . Hypertension    Past Surgical History:  Procedure Laterality Date  . CESAREAN SECTION N/A 03/23/2015   Procedure: CESAREAN SECTION;  Surgeon: Herold Harms, MD;  Location: [facility] ORS;  Service: Obstetrics;  Laterality: N/A;  . NO PAST SURGERIES      Current Outpatient Medications:  .  ACCU-CHEK GUIDE test strip, USE 1 STRIP 4 TIMES A DAY, Disp: , Rfl:  .  Alcohol Swabs (ALCOHOL PREP) 70 % PADS, , Disp: , Rfl:  .  BD PEN NEEDLE NANO U/F 32G X 4 MM MISC, TEST 4 TIMES A DAY AS NEEDED, Disp: , Rfl:  .  empagliflozin (JARDIANCE) 25 MG TABS tablet, Take 25 mg by mouth daily., Disp: , Rfl:  .  ibuprofen (ADVIL,MOTRIN) 800 MG tablet, Take 1 tablet (800 mg total) by mouth every 8 (eight) hours., Disp: 60 tablet, Rfl: 1 .  Insulin Glargine (BASAGLAR KWIKPEN) 100 UNIT/ML SOPN, , Disp: , Rfl:  .  insulin lispro (HUMALOG) 100 UNIT/ML injection, Inject into the skin as needed for high blood sugar., Disp: , Rfl:  .  Lancets (ONETOUCH DELICA PLUS LANCET33G) MISC, , Disp: , Rfl:  .  metFORMIN (GLUCOPHAGE) 500 MG tablet, Take by mouth 2 (two) times daily with a meal., Disp: , Rfl:  .  NEOMYCIN-POLYMYXIN-HYDROCORTISONE (CORTISPORIN) 1 % SOLN OTIC solution, Apply 1-2 drops to toe BID after soaking, Disp: 10 mL, Rfl: 1 .  norgestimate-ethinyl estradiol  (ORTHO-CYCLEN,SPRINTEC,PREVIFEM) 0.25-35 MG-MCG tablet, Take 1 tablet by mouth daily., Disp: 3 Package, Rfl: 4 .  NOVOLIN R FLEXPEN RELION 100 UNIT/ML SOPN, TAKE AS DIRECTED USE SLIDING SCALE 25 30 MINUTES PRIOR TO MEALS 5 10 UNITS BEFORE MEALS, Disp: , Rfl:  .  NOVOLOG FLEXPEN 100 UNIT/ML FlexPen, SMARTSIG:5 Unit(s) SUB-Q 3 Times Daily, Disp: , Rfl:  .  pantoprazole (PROTONIX) 40 MG tablet, , Disp: , Rfl:  .  pioglitazone (ACTOS) 45 MG tablet, , Disp: , Rfl:   No Known Allergies Review of Systems Objective:   Vitals:   06/17/19 1502  Temp: 98.6 F (37 C)    General: Well developed, nourished, in no acute distress, alert and oriented x3   Dermatological: Skin is warm, dry and supple bilateral. Nails x 10 are well maintained; remaining integument appears unremarkable at this time. There are no open sores, no preulcerative lesions, no rash or signs of infection present.  Sharp incurvated nail margins tibiofibular border of the hallux left mild erythema no cellulitis drainage or odor.  Vascular: Dorsalis Pedis artery and Posterior Tibial artery pedal pulses are 2/4 bilateral with immedate capillary fill time. Pedal hair growth present. No varicosities and no lower extremity edema present bilateral.   Neruologic: Grossly intact via light touch bilateral. Vibratory intact via tuning fork bilateral. Protective threshold with Semmes Wienstein monofilament intact to all  pedal sites bilateral. Patellar and Achilles deep tendon reflexes 2+ bilateral. No Babinski or clonus noted bilateral.   Musculoskeletal: No gross boney pedal deformities bilateral. No pain, crepitus, or limitation noted with foot and ankle range of motion bilateral. Muscular strength 5/5 in all groups tested bilateral.  Gait: Unassisted, Nonantalgic.    Radiographs:  None taken  Assessment & Plan:   Assessment: Ingrown toenail tibiofibular border hallux left  Plan: Chemical matrixectomy was performed provided today  tibiofibular border of the hallux left after local anesthetic was administered.  She tolerated procedure well without complications and was provided both oral and written home-going instructions for the care and soaking of the toe as well as a prescription for Corticosporin otic to be applied twice daily after soaking.  I will follow-up with her in 2 weeks to make sure she is doing well.     Syanna Remmert T. [place], [place]

## 2019-06-17 NOTE — Patient Instructions (Signed)

## 2019-07-06 ENCOUNTER — Encounter: Payer: Self-pay | Admitting: Podiatry

## 2019-07-06 ENCOUNTER — Other Ambulatory Visit: Payer: Self-pay

## 2019-07-06 ENCOUNTER — Ambulatory Visit: Payer: BC Managed Care – PPO | Admitting: Podiatrist

## 2019-07-06 ENCOUNTER — Ambulatory Visit (INDEPENDENT_AMBULATORY_CARE_PROVIDER_SITE_OTHER): Payer: BC Managed Care – PPO | Admitting: Podiatry

## 2019-07-06 DIAGNOSIS — E109 Type 1 diabetes mellitus without complications: Secondary | ICD-10-CM

## 2019-07-06 DIAGNOSIS — L600 Ingrowing nail: Secondary | ICD-10-CM | POA: Insufficient documentation

## 2019-07-06 DIAGNOSIS — O24414 Gestational diabetes mellitus in pregnancy, insulin controlled: Secondary | ICD-10-CM

## 2019-07-06 NOTE — Progress Notes (Signed)
This patient presents the office with chief complain of pain and bleeding on the outside border of the big toenail right foot.  She also says she occasionally has pain on the inside border but is pain-free today.  This patient was seen by myself in January and told that she has ingrowing toenails both big toes and she presents the office today for nail surgery for the removal of the ingrowing toenails on the right great toe.  She says she saw Dr. Al Corpus about 3 weeks ago and he performed nail surgery for the removal of the nail borders on the left great toe.  She presents the office today for permanent correction of the ingrowing toenails right great toenail.  Vascular  Dorsalis pedis and posterior tibial pulses are palpable  B/L.  Capillary return  WNL.  Temperature gradient is  WNL.  Skin turgor  WNL  Sensorium  Senn Weinstein monofilament wire  WNL. Normal tactile sensation.  Nail Exam  Patient has normal nails with no evidence of bacterial or fungal infection. Marked incurvation of medial and lateral borders right great toenail.    Orthopedic  Exam  Muscle tone and muscle strength  WNL.  No limitations of motion feet  B/L.  No crepitus or joint effusion noted.  Foot type is unremarkable and digits show no abnormalities.  Bony prominences are unremarkable.  Skin  No open lesions.  Normal skin texture and turgor.  Ingrown Toenail medial and lateral border right hallux.  ROV.  Nail surgery.  Treatment options and alternatives discussed.  Recommended permanent phenol matrixectomy and patient agreed.  Right hallux  was prepped with alcohol and a toe block of 3cc of 2% lidocaine plain was administered in a digital toe block. .  The toe was then prepped with betadine solution . A tourniquet was applied to toe. The offending nail borders were  then excised and matrix tissue exposed.  Phenol was then applied to the matrix tissue followed by an alcohol wash.  Antibiotic ointment and a dry sterile dressing was  applied.  The patient was dispensed instructions for aftercare.  RTC 2 weeks.     Helane Gunther DPM

## 2019-07-06 NOTE — Patient Instructions (Signed)

## 2019-07-13 ENCOUNTER — Encounter: Payer: Self-pay | Admitting: Podiatry

## 2019-07-13 ENCOUNTER — Other Ambulatory Visit: Payer: Self-pay

## 2019-07-13 ENCOUNTER — Ambulatory Visit (INDEPENDENT_AMBULATORY_CARE_PROVIDER_SITE_OTHER): Payer: BC Managed Care – PPO | Admitting: Podiatry

## 2019-07-13 DIAGNOSIS — Z9889 Other specified postprocedural states: Secondary | ICD-10-CM

## 2019-07-13 DIAGNOSIS — L600 Ingrowing nail: Secondary | ICD-10-CM

## 2019-07-13 NOTE — Progress Notes (Signed)
She presents today for follow-up of her matrixectomy's one was performed on 07/06/2019 the other on 06/17/2019 the first 1 was by myself in the left great toe Dr. Stacie Acres performed the one in April of the right great toe.  Denies fever chills nausea vomiting muscle aches and pain states that the right toe is still little bit tender.  Objective: Vital signs are stable alert and oriented x3 the left with is gone on to heal uneventfully the right what appears to be granulating and epithelializing very nicely.  Assessment: Well-healing surgical toes.  Plan: Continue soak Epson salts and warm water for another 2 weeks cover during the day but leave open at bedtime.  Follow-up with me if this does not get well.

## 2023-05-03 ENCOUNTER — Encounter: Payer: Self-pay | Admitting: Family Medicine

## 2023-05-03 ENCOUNTER — Ambulatory Visit (INDEPENDENT_AMBULATORY_CARE_PROVIDER_SITE_OTHER): Payer: Self-pay | Admitting: Family Medicine

## 2023-05-03 VITALS — BP 132/78 | HR 81 | Temp 97.6°F | Resp 16 | Ht 64.0 in | Wt 183.8 lb

## 2023-05-03 DIAGNOSIS — F419 Anxiety disorder, unspecified: Secondary | ICD-10-CM

## 2023-05-03 DIAGNOSIS — E118 Type 2 diabetes mellitus with unspecified complications: Secondary | ICD-10-CM

## 2023-05-03 DIAGNOSIS — Z794 Long term (current) use of insulin: Secondary | ICD-10-CM

## 2023-05-03 DIAGNOSIS — E1169 Type 2 diabetes mellitus with other specified complication: Secondary | ICD-10-CM

## 2023-05-03 NOTE — Progress Notes (Unsigned)
   Established Patient Office Visit  Subjective   Patient ID: Brittany White, female    DOB: [birth year]  Age: 38 y.o. MRN: [id number]  No chief complaint on file.   HPI   {History (Optional):23778}  ROS    Objective:     There were no vitals taken for this visit. {Vitals History (Optional):23777}  Physical Exam Vitals and nursing note reviewed.  Constitutional:      Appearance: Normal appearance.  HENT:     Head: Normocephalic and atraumatic.  Eyes:     Conjunctiva/sclera: Conjunctivae normal.  Cardiovascular:     Rate and Rhythm: Normal rate and regular rhythm.  Pulmonary:     Effort: Pulmonary effort is normal.     Breath sounds: Normal breath sounds.  Musculoskeletal:     Right lower leg: No edema.     Left lower leg: No edema.  Skin:    General: Skin is warm and dry.  Neurological:     Mental Status: She is alert and oriented to person, place, and time.  Psychiatric:        Mood and Affect: Mood normal.        Behavior: Behavior normal.        Thought Content: Thought content normal.        Judgment: Judgment normal.      {Perform Simple Foot Exam  Perform Detailed exam:1} Diabetic foot exam was performed with the following findings:   No data filed       No results found for any visits on 05/03/23.  {Labs (Optional):23779}  The ASCVD Risk score (Arnett DK, et al., 2019) failed to calculate for the following reasons:   The 2019 ASCVD risk score is only valid for ages 63 to 75    Assessment & Plan:  There are no diagnoses linked to this encounter.   No follow-ups on file.    Alease Medina, MD

## 2023-05-06 DIAGNOSIS — E118 Type 2 diabetes mellitus with unspecified complications: Secondary | ICD-10-CM | POA: Insufficient documentation

## 2023-05-06 NOTE — Assessment & Plan Note (Signed)
Currently controlled with Mounjaro Zetia 7.5 mg pioglitazone 45 and occasional metformin and Jardiance 25 mg daily.  Checking her A1c CMP lipid and urine micro albumin creatinine ratio.

## 2023-05-07 ENCOUNTER — Telehealth: Payer: Self-pay

## 2023-05-07 ENCOUNTER — Encounter: Payer: Self-pay | Admitting: Family Medicine

## 2023-05-07 DIAGNOSIS — E118 Type 2 diabetes mellitus with unspecified complications: Secondary | ICD-10-CM

## 2023-05-07 MED ORDER — MOUNJARO 7.5 MG/0.5ML ~~LOC~~ SOAJ: 7.5000 mg | SUBCUTANEOUS | 5 refills | Status: DC

## 2023-05-07 MED ORDER — EMPAGLIFLOZIN 25 MG PO TABS: 25.0000 mg | ORAL_TABLET | Freq: Every day | ORAL | 5 refills | Status: AC

## 2023-05-07 MED ORDER — BUSPIRONE HCL 5 MG PO TABS: 5.0000 mg | ORAL_TABLET | Freq: Two times a day (BID) | ORAL | 5 refills | Status: DC

## 2023-05-07 MED ORDER — PIOGLITAZONE HCL 45 MG PO TABS: 45.0000 mg | ORAL_TABLET | Freq: Every day | ORAL | 5 refills | Status: DC

## 2023-05-07 NOTE — Telephone Encounter (Signed)
 Copied from CRM [id number]. Topic: Clinical - Prescription Issue >> May 06, 2023 11:43 AM Curlee DEL wrote: Reason for CRM: Patient was seen on Friday where they discussed a number of medications being refilled along with something for her anxiety (see encounter). She checked with her local [facility] on [address] and no medications were called in.

## 2023-05-07 NOTE — Addendum Note (Signed)
Addended by: Alease Medina on: 05/07/2023 11:26 AM   Modules accepted: Orders

## 2023-05-10 ENCOUNTER — Other Ambulatory Visit: Payer: Self-pay

## 2023-05-10 ENCOUNTER — Encounter: Payer: Self-pay | Admitting: Family Medicine

## 2023-05-10 LAB — CBC WITH DIFFERENTIAL/PLATELET
Basophils Absolute: 0 10*3/uL (ref 0.0–0.2)
Basos: 1 %
EOS (ABSOLUTE): 0 10*3/uL (ref 0.0–0.4)
Eos: 0 %
Hematocrit: 38 % (ref 34.0–46.6)
Hemoglobin: 11.9 g/dL (ref 11.1–15.9)
Immature Grans (Abs): 0.1 10*3/uL (ref 0.0–0.1)
Immature Granulocytes: 1 %
Lymphocytes Absolute: 2.8 10*3/uL (ref 0.7–3.1)
Lymphs: 34 %
MCH: 24 pg — ABNORMAL LOW (ref 26.6–33.0)
MCHC: 31.3 g/dL — ABNORMAL LOW (ref 31.5–35.7)
MCV: 77 fL — ABNORMAL LOW (ref 79–97)
Monocytes Absolute: 0.7 10*3/uL (ref 0.1–0.9)
Monocytes: 9 %
Neutrophils Absolute: 4.4 10*3/uL (ref 1.4–7.0)
Neutrophils: 55 %
Platelets: 265 10*3/uL (ref 150–450)
RBC: 4.95 x10E6/uL (ref 3.77–5.28)
RDW: 14.2 % (ref 11.7–15.4)
WBC: 8.1 10*3/uL (ref 3.4–10.8)

## 2023-05-10 LAB — CMP14+EGFR
ALT: 8 [IU]/L (ref 0–32)
AST: 10 [IU]/L (ref 0–40)
Albumin: 4.2 g/dL (ref 3.9–4.9)
Alkaline Phosphatase: 73 [IU]/L (ref 44–121)
BUN/Creatinine Ratio: 9 (ref 9–23)
BUN: 8 mg/dL (ref 6–20)
Bilirubin Total: 0.2 mg/dL (ref 0.0–1.2)
CO2: 22 mmol/L (ref 20–29)
Calcium: 9.1 mg/dL (ref 8.7–10.2)
Chloride: 104 mmol/L (ref 96–106)
Creatinine, Ser: 0.92 mg/dL (ref 0.57–1.00)
Globulin, Total: 2.3 g/dL (ref 1.5–4.5)
Glucose: 143 mg/dL — ABNORMAL HIGH (ref 70–99)
Potassium: 3.7 mmol/L (ref 3.5–5.2)
Sodium: 138 mmol/L (ref 134–144)
Total Protein: 6.5 g/dL (ref 6.0–8.5)
eGFR: 83 mL/min/{1.73_m2} (ref >59)

## 2023-05-10 LAB — LIPID PANEL
Chol/HDL Ratio: 2.5 {ratio} (ref 0.0–4.4)
Cholesterol, Total: 131 mg/dL (ref 100–199)
HDL: 53 mg/dL (ref >39)
LDL Chol Calc (NIH): 65 mg/dL (ref 0–99)
Triglycerides: 59 mg/dL (ref 0–149)
VLDL Cholesterol Cal: 13 mg/dL (ref 5–40)

## 2023-05-10 LAB — TSH+FREE T4
Free T4: 1.11 ng/dL (ref 0.82–1.77)
TSH: 0.848 u[IU]/mL (ref 0.450–4.500)

## 2023-05-10 LAB — MICROALBUMIN / CREATININE URINE RATIO
Creatinine, Urine: 163.9 mg/dL
Microalb/Creat Ratio: <2 mg/g{creat} (ref 0–29)
Microalbumin, Urine: <3 ug/mL

## 2023-05-10 LAB — VITAMIN D, 25-HYDROXY, TOTAL: Vitamin D, 25-Hydroxy, Serum: 25 ng/mL — ABNORMAL LOW

## 2023-05-10 LAB — HEMOGLOBIN A1C
Est. average glucose Bld gHb Est-mCnc: 111 mg/dL
Hgb A1c MFr Bld: 5.5 % (ref 4.8–5.6)

## 2023-05-10 MED ORDER — NORGESTIMATE-ETH ESTRADIOL 0.25-35 MG-MCG PO TABS: 1.0000 | ORAL_TABLET | Freq: Every day | ORAL | 3 refills | Status: DC

## 2023-05-14 MED ORDER — DAPAGLIFLOZIN PROPANEDIOL 10 MG PO TABS: 10.0000 mg | ORAL_TABLET | Freq: Every day | ORAL | 5 refills | Status: DC

## 2023-05-24 ENCOUNTER — Other Ambulatory Visit: Payer: Self-pay

## 2023-05-24 DIAGNOSIS — F419 Anxiety disorder, unspecified: Secondary | ICD-10-CM

## 2023-05-24 DIAGNOSIS — E1169 Type 2 diabetes mellitus with other specified complication: Secondary | ICD-10-CM

## 2023-05-24 MED ORDER — BUSPIRONE HCL 5 MG PO TABS: 5.0000 mg | ORAL_TABLET | Freq: Two times a day (BID) | ORAL | 0 refills | Status: DC

## 2023-07-31 ENCOUNTER — Ambulatory Visit: Payer: Self-pay | Admitting: Family Medicine

## 2023-08-06 ENCOUNTER — Ambulatory Visit (INDEPENDENT_AMBULATORY_CARE_PROVIDER_SITE_OTHER): Payer: Self-pay | Admitting: Family Medicine

## 2023-08-06 ENCOUNTER — Encounter: Payer: Self-pay | Admitting: Family Medicine

## 2023-08-06 VITALS — BP 128/83 | HR 83 | Temp 97.9°F | Resp 18 | Ht 64.0 in | Wt 188.0 lb

## 2023-08-06 DIAGNOSIS — E118 Type 2 diabetes mellitus with unspecified complications: Secondary | ICD-10-CM | POA: Diagnosis not present

## 2023-08-06 DIAGNOSIS — Z794 Long term (current) use of insulin: Secondary | ICD-10-CM

## 2023-08-06 DIAGNOSIS — E1169 Type 2 diabetes mellitus with other specified complication: Secondary | ICD-10-CM

## 2023-08-06 DIAGNOSIS — T7840XD Allergy, unspecified, subsequent encounter: Secondary | ICD-10-CM

## 2023-08-06 DIAGNOSIS — Z8669 Personal history of other diseases of the nervous system and sense organs: Secondary | ICD-10-CM

## 2023-08-06 DIAGNOSIS — Z3041 Encounter for surveillance of contraceptive pills: Secondary | ICD-10-CM

## 2023-08-06 DIAGNOSIS — E66811 Obesity, class 1: Secondary | ICD-10-CM

## 2023-08-06 DIAGNOSIS — F419 Anxiety disorder, unspecified: Secondary | ICD-10-CM

## 2023-08-06 DIAGNOSIS — E559 Vitamin D deficiency, unspecified: Secondary | ICD-10-CM

## 2023-08-06 MED ORDER — BUSPIRONE HCL 5 MG PO TABS: 5.0000 mg | ORAL_TABLET | Freq: Two times a day (BID) | ORAL | 0 refills | Status: DC

## 2023-08-06 MED ORDER — MOUNJARO 7.5 MG/0.5ML ~~LOC~~ SOAJ: 7.5000 mg | SUBCUTANEOUS | 5 refills | Status: DC

## 2023-08-06 MED ORDER — NORGESTIMATE-ETH ESTRADIOL 0.25-35 MG-MCG PO TABS: 1.0000 | ORAL_TABLET | Freq: Every day | ORAL | 3 refills | Status: DC

## 2023-08-06 MED ORDER — METFORMIN HCL 500 MG PO TABS: 500.0000 mg | ORAL_TABLET | Freq: Two times a day (BID) | ORAL | 0 refills | Status: DC

## 2023-08-06 MED ORDER — ALLEGRA-D ALLERGY & CONGESTION 180-240 MG PO TB24: 1.0000 | ORAL_TABLET | Freq: Every day | ORAL | 0 refills | Status: AC

## 2023-08-06 MED ORDER — IBUPROFEN 800 MG PO TABS: 800.0000 mg | ORAL_TABLET | Freq: Three times a day (TID) | ORAL | 1 refills | Status: DC

## 2023-08-06 NOTE — Assessment & Plan Note (Signed)
 BMI is 32+ today.  She is up about 5 pounds from January.  She has an excellent diet.  Recommend that she increase her exercise

## 2023-08-06 NOTE — Progress Notes (Unsigned)
 Established Patient Office Visit  Subjective   Patient ID: Brittany White, female    DOB: 1986/01/05  Age: 38 y.o. MRN: [id number]  Chief Complaint  Patient presents with   Medical Management of Chronic Issues   Vaginitis    HPI pleasant 38 year old woman with DMT2, GERD, anxiety. PHQ= 2 GAD-7= 3. Reports she is doing really well.  Most fasting blood sugars are 130 or less.  She is on Farxiga  10 mg, pioglitazone  45 mg, Mounjaro 7.5 mg weekly, metformin  500 mg q am or twice daily.(If her blood glucose is elevated in the evening then she takes a second metformin , otherwise she does not).  She still exercises Monday, Wednesdays and Fridays.  She has not seen the eye doctor yet this year.  She denies any episodes of low blood sugar or headache, dizziness, confusion, nausea or headache.  She asks if there is a generic for Mounjaro because her co-pay is currently $50 a month. She started taking vitamin D3 5000 IU. Anxiety is well-controlled.  As needed BuSpar  5mg .     {History (Optional):23778}  ROS    Objective:     BP 128/83 (BP Location: Left Arm, Patient Position: Sitting, Cuff Size: Normal)   Pulse 83   Temp 97.9 F (36.6 C) (Oral)   Resp 18   Ht 5\' 4"  (1.626 m)   Wt 188 lb (85.3 kg)   LMP  (LMP Unknown)   SpO2 98%   Breastfeeding No   BMI 32.27 kg/m  {Vitals History (Optional):23777}  Physical Exam Vitals reviewed.  Constitutional:      Appearance: Normal appearance.  HENT:     Head: Normocephalic.  Eyes:     General:        Right eye: No discharge.        Left eye: No discharge.  Cardiovascular:     Rate and Rhythm: Normal rate.  Pulmonary:     Effort: Pulmonary effort is normal.  Neurological:     Mental Status: She is alert and oriented to person, place, and time.  Psychiatric:        Mood and Affect: Mood normal.        Behavior: Behavior normal.        Thought Content: Thought content normal.        Judgment: Judgment normal.      {Perform  Simple Foot Exam  Perform Detailed exam:1} {Insert foot Exam (Optional):30965}   No results found for any visits on 08/06/23.  {Labs (Optional):23779}  The ASCVD Risk score (Arnett DK, et al., 2019) failed to calculate for the following reasons:   The 2019 ASCVD risk score is only valid for ages 18 to 15    Assessment & Plan:  Controlled type 2 diabetes mellitus with complication, without long-term current use of insulin  (HCC) -     POCT glycosylated hemoglobin (Hb A1C) -     metFORMIN  HCl; Take 1 tablet (500 mg total) by mouth 2 (two) times daily with a meal.  Dispense: 180 tablet; Refill: 0 -     Lipid panel -     CBC with Differential/Platelet -     Comprehensive metabolic panel with GFR -     Hemoglobin A1c -     Microalbumin / creatinine urine ratio  Type 2 diabetes mellitus with other specified complication, with long-term current use of insulin  (HCC) -     Mounjaro; Inject 7.5 mg into the skin once a week.  Dispense: 2 mL; Refill:  5  Anxiety -     busPIRone  HCl; Take 1 tablet (5 mg total) by mouth 2 (two) times daily.  Dispense: 180 tablet; Refill: 0  Encounter for surveillance of contraceptive pills -     Norgestimate -Eth Estradiol ; Take 1 tablet by mouth daily.  Dispense: 30 tablet; Refill: 3  Allergy, subsequent encounter -     Allegra-D Allergy & Congestion; Take 1 tablet by mouth daily.  Dispense: 90 tablet; Refill: 0  Hx of migraine headaches -     Ibuprofen ; Take 1 tablet (800 mg total) by mouth every 8 (eight) hours.  Dispense: 60 tablet; Refill: 1  Vitamin D  deficiency Assessment & Plan: She is taking vitamin D3 5000 IU daily.  Checking her vitamin D  level.  Orders: -     VITAMIN D  25 Hydroxy (Vit-D Deficiency, Fractures)  DM (diabetes mellitus), type 2 with complications [facility]) Assessment & Plan: On 05/03/2023 A1c was 5.5%.  Checking A1c today.  Reports fasting blood sugars are always 130 or less.  Ask her to ask the pharmacist if there is a less expensive  GLP-1 available for her.   Obesity (BMI 30.0-34.9) Assessment & Plan: BMI is 32+ today.  She is up about 5 pounds from January.  She has an excellent diet.  Recommend that she increase her exercise      Return in about 1 week (around 08/13/2023), or schedule for pap test..    Cambria Osten K Meggin Ola, MD

## 2023-08-06 NOTE — Assessment & Plan Note (Signed)
 She is taking vitamin D3 5000 IU daily.  Checking her vitamin D  level.

## 2023-08-06 NOTE — Assessment & Plan Note (Signed)
 On 05/03/2023 A1c was 5.5%.  Checking A1c today.  Reports fasting blood sugars are always 130 or less.  Ask her to ask the pharmacist if there is a less expensive GLP-1 available for her.

## 2023-08-11 LAB — CBC WITH DIFFERENTIAL/PLATELET
Basophils Absolute: 0 10*3/uL (ref 0.0–0.2)
Basos: 0 %
EOS (ABSOLUTE): 0 10*3/uL (ref 0.0–0.4)
Eos: 0 %
Hematocrit: 38 % (ref 34.0–46.6)
Hemoglobin: 12 g/dL (ref 11.1–15.9)
Immature Grans (Abs): 0 10*3/uL (ref 0.0–0.1)
Immature Granulocytes: 0 %
Lymphocytes Absolute: 2 10*3/uL (ref 0.7–3.1)
Lymphs: 21 %
MCH: 24.3 pg — ABNORMAL LOW (ref 26.6–33.0)
MCHC: 31.6 g/dL (ref 31.5–35.7)
MCV: 77 fL — ABNORMAL LOW (ref 79–97)
Monocytes Absolute: 0.8 10*3/uL (ref 0.1–0.9)
Monocytes: 9 %
Neutrophils Absolute: 6.6 10*3/uL (ref 1.4–7.0)
Neutrophils: 70 %
Platelets: 290 10*3/uL (ref 150–450)
RBC: 4.93 x10E6/uL (ref 3.77–5.28)
RDW: 14.4 % (ref 11.7–15.4)
WBC: 9.4 10*3/uL (ref 3.4–10.8)

## 2023-08-11 LAB — LIPID PANEL
Chol/HDL Ratio: 2.7 ratio (ref 0.0–4.4)
Cholesterol, Total: 138 mg/dL (ref 100–199)
HDL: 52 mg/dL (ref >39)
LDL Chol Calc (NIH): 71 mg/dL (ref 0–99)
Triglycerides: 73 mg/dL (ref 0–149)
VLDL Cholesterol Cal: 15 mg/dL (ref 5–40)

## 2023-08-11 LAB — COMPREHENSIVE METABOLIC PANEL WITH GFR
ALT: 9 IU/L (ref 0–32)
AST: 12 IU/L (ref 0–40)
Albumin: 4.1 g/dL (ref 3.9–4.9)
Alkaline Phosphatase: 78 IU/L (ref 44–121)
BUN/Creatinine Ratio: 9 (ref 9–23)
BUN: 8 mg/dL (ref 6–20)
Bilirubin Total: 0.4 mg/dL (ref 0.0–1.2)
CO2: 21 mmol/L (ref 20–29)
Calcium: 9 mg/dL (ref 8.7–10.2)
Chloride: 104 mmol/L (ref 96–106)
Creatinine, Ser: 0.87 mg/dL (ref 0.57–1.00)
Globulin, Total: 2.5 g/dL (ref 1.5–4.5)
Glucose: 120 mg/dL — ABNORMAL HIGH (ref 70–99)
Potassium: 4.1 mmol/L (ref 3.5–5.2)
Sodium: 140 mmol/L (ref 134–144)
Total Protein: 6.6 g/dL (ref 6.0–8.5)
eGFR: 89 mL/min/{1.73_m2} (ref >59)

## 2023-08-11 LAB — MICROALBUMIN / CREATININE URINE RATIO
Creatinine, Urine: 181.5 mg/dL
Microalb/Creat Ratio: <2 mg/g{creat} (ref 0–29)
Microalbumin, Urine: <3 ug/mL

## 2023-08-11 LAB — HEMOGLOBIN A1C
Est. average glucose Bld gHb Est-mCnc: 114 mg/dL
Hgb A1c MFr Bld: 5.6 % (ref 4.8–5.6)

## 2023-08-11 LAB — VITAMIN D 25 HYDROXY (VIT D DEFICIENCY, FRACTURES): Vit D, 25-Hydroxy: 21 ng/mL — ABNORMAL LOW (ref 30.0–100.0)

## 2023-08-12 ENCOUNTER — Encounter: Payer: Self-pay | Admitting: Family Medicine

## 2023-08-16 ENCOUNTER — Other Ambulatory Visit: Payer: Self-pay | Admitting: Family Medicine

## 2023-08-16 ENCOUNTER — Telehealth: Payer: Self-pay

## 2023-08-16 DIAGNOSIS — N760 Acute vaginitis: Secondary | ICD-10-CM

## 2023-08-16 MED ORDER — FLUCONAZOLE 150 MG PO TABS: 150.0000 mg | ORAL_TABLET | Freq: Every day | ORAL | 0 refills | Status: AC

## 2023-08-16 NOTE — Telephone Encounter (Signed)
 Copied from CRM [id number]. Topic: Clinical - Medication Question >> Aug 16, 2023 12:25 PM Phil Braun wrote: Reason for CRM:  Ref: Pt is requesting Diflucan , she is having yeast infections.   [id number] [place], [place] [phone] [[id number]]

## 2023-08-20 ENCOUNTER — Other Ambulatory Visit: Payer: Self-pay | Admitting: Family Medicine

## 2023-08-20 DIAGNOSIS — E118 Type 2 diabetes mellitus with unspecified complications: Secondary | ICD-10-CM

## 2023-08-20 MED ORDER — MOUNJARO 7.5 MG/0.5ML ~~LOC~~ SOAJ: 7.5000 mg | SUBCUTANEOUS | 1 refills | Status: DC

## 2023-09-03 ENCOUNTER — Telehealth: Payer: Self-pay | Admitting: Family Medicine

## 2023-09-03 ENCOUNTER — Ambulatory Visit: Payer: Self-pay

## 2023-09-03 NOTE — Telephone Encounter (Signed)
 Called and LM that I called about the her question about the yeast pills.  I need clarification.  Dr.Jessey Huyett

## 2023-09-03 NOTE — Telephone Encounter (Signed)
 Copied from CRM [id number]. Topic: Clinical - Medication Question >> Sep 03, 2023  2:29 PM Stanly Early wrote: Reason for CRM: patient is requesting a monthly prescription for her yeast infection. fluconazole  (DIFLUCAN ) 150 MG tablet she stated she got one pill but would like something on going.

## 2023-09-03 NOTE — Telephone Encounter (Signed)
 Copied from CRM [id number]. Topic: Clinical - Medication Question >> Sep 03, 2023  2:29 PM Stanly Early wrote: Reason for CRM: patient is requesting a monthly prescription for her yeast infection. fluconazole  (DIFLUCAN ) 150 MG tablet she stated she got one pill but would like something on going.   Patient requesting a weekly Diflucan  150 mg prescription for chronic yeast infections that occur due to her diabetes. She states she made a request for the same but only got 1 pill and states she used to get 12 pills to take once a week. Patient would like to restart her medication this way, or would like to know other options she can use to treat her yeast infections stating she has been using OTC treatments but they are more expensive than a prescription. Please advise.    Reason for Disposition  Prescription request for new medicine (not a refill)  Answer Assessment - Initial Assessment Questions 1. NAME of MEDICINE: "What medicine(s) are you calling about?"     Diflucan   2. QUESTION: "What is your question?" (e.g., double dose of medicine, side effect)     Could it be prescribed like it was in the past, 1 pill once a week.  3. PRESCRIBER: "Who prescribed the medicine?" Reason: if prescribed by specialist, call should be referred to that group.     Dr. Ziglar  Protocols used: Medication Question Call-A-AH

## 2023-09-04 ENCOUNTER — Telehealth: Payer: Self-pay | Admitting: Family Medicine

## 2023-09-04 NOTE — Telephone Encounter (Signed)
 Spoke with patient.  She has been buying an OTC AZO product for yeast infections.  She uses this every week whether she ahs symptoms or not.  Advised that her BS is under such good control that she likely is not getting yeast infections anymore.  So, don't use the AZO product anymore and see how she does.  If she thinks she has an infection then come in and let us  test for that.  Best regards

## 2023-11-13 ENCOUNTER — Other Ambulatory Visit: Payer: Self-pay | Admitting: Family Medicine

## 2023-11-13 DIAGNOSIS — F419 Anxiety disorder, unspecified: Secondary | ICD-10-CM

## 2023-11-14 ENCOUNTER — Other Ambulatory Visit: Payer: Self-pay | Admitting: Family Medicine

## 2023-11-14 DIAGNOSIS — E118 Type 2 diabetes mellitus with unspecified complications: Secondary | ICD-10-CM

## 2023-12-01 ENCOUNTER — Other Ambulatory Visit: Payer: Self-pay | Admitting: Family Medicine

## 2023-12-01 DIAGNOSIS — Z3041 Encounter for surveillance of contraceptive pills: Secondary | ICD-10-CM

## 2024-01-14 ENCOUNTER — Telehealth: Payer: Self-pay

## 2024-01-14 NOTE — Telephone Encounter (Signed)
 Copied from CRM [id number]. Topic: Clinical - Prescription Issue >> Jan 14, 2024  3:07 PM Brittany White wrote: Reason for CRM: Patient called started taking tirzepatide  (MOUNJARO ) 7.5 MG/0.5ML Pen - making her sick, now switching to once every other week but still causing nausea.Brittany White would like to change back to ozempic?  She does have another appt but doesn't want to wait to get ozempic back?  Pls call patient to discuss

## 2024-01-15 ENCOUNTER — Other Ambulatory Visit: Payer: Self-pay | Admitting: Family Medicine

## 2024-01-15 ENCOUNTER — Ambulatory Visit: Payer: Self-pay | Admitting: Family Medicine

## 2024-01-15 ENCOUNTER — Telehealth: Payer: Self-pay | Admitting: Family Medicine

## 2024-01-15 DIAGNOSIS — E118 Type 2 diabetes mellitus with unspecified complications: Secondary | ICD-10-CM

## 2024-01-15 MED ORDER — TIRZEPATIDE 5 MG/0.5ML ~~LOC~~ SOAJ: 5.0000 mg | SUBCUTANEOUS | 3 refills | Status: DC

## 2024-01-15 NOTE — Telephone Encounter (Signed)
 FYI Only or Action Required?: Action required by provider: clinical question for provider.  Patient was last seen in primary care on 08/06/2023 by Ziglar, Susan K, MD.  Called Nurse Triage reporting Medication Problem.  Triage Disposition: Call PCP When Office is Open  Patient/caregiver understands and will follow disposition?: Yes          Copied from CRM #[id number]. Topic: Clinical - Prescription Issue >> Jan 15, 2024 12:29 PM Precious C wrote: Reason for CRM: : Patient called  to check on status of call back. She stated that she's taking tirzepatide  (MOUNJARO ) 7.5 MG/0.5ML Pen - and making her sick and still causing nausea, and she would like to change back to ozempic?  Pls is requesting a call back asap as she is diabetic and can't go without RX for too long.  [phone] Reason for Disposition  [1] Caller has NON-URGENT medicine question about med that PCP prescribed AND [2] triager unable to answer question  Answer Assessment - Initial Assessment Questions Pt requesting to switch back to Ozempic 2.5 mg from Mounjaro  7.5 mg. Pt reports current weight of 180 lbs (previously 232 lbs last year).  Pt states she has been experiencing nausea and vomiting with Mounjaro  for the past 3 months. She reduced injections to every other week but continues to have the same symptoms each time.  Despite side effects, pt reports she needs to remain on medication as it is the only thing helping manage her blood sugar levels.  Pt did not take Mounjaro  on Sunday and is requesting a call back today with an update regarding switching medications.  [phone] [address], [place], [place] [address] Phone: [phone] Fax: [phone]  Protocols used: Medication Question Call-A-AH

## 2024-01-15 NOTE — Telephone Encounter (Signed)
 Called patient but she did not answer. LM that I was calling about her Mounjaro  injections.

## 2024-01-16 ENCOUNTER — Telehealth: Payer: Self-pay

## 2024-01-16 NOTE — Telephone Encounter (Signed)
 Copied from CRM [id number]. Topic: Clinical - Prescription Issue >> Jan 15, 2024  5:07 PM Nathanel BROCKS wrote: Reason for CRM: tirzepatide  (MOUNJARO ) 5 MG/0.5ML Pen needs prior authorization  Pt has [employer] ins now.   Member id number [id number]  Group number [id number]   Provider services number [phone]

## 2024-01-16 NOTE — Telephone Encounter (Signed)
(  Key: [facility])  Rx #: [id number]  Mounjaro  5MG /0.5ML auto-injectors  Form [employer] Electronic Prior Authorization Form [id number] N[place])

## 2024-01-16 NOTE — Telephone Encounter (Signed)
 Message from Plan  Request Reference Number: [id number].   MOUNJARO  INJ 5MG /0.5 is approved through 01/15/2025.   Your patient may now fill this prescription and it will be covered.   Authorization Expiration Date: January 15, 2025.

## 2024-01-31 ENCOUNTER — Telehealth: Payer: Self-pay

## 2024-01-31 ENCOUNTER — Ambulatory Visit: Admitting: Family Medicine

## 2024-01-31 ENCOUNTER — Encounter: Payer: Self-pay | Admitting: Family Medicine

## 2024-01-31 VITALS — BP 122/85 | HR 81 | Temp 98.3°F | Resp 18 | Ht 64.0 in | Wt 200.0 lb

## 2024-01-31 DIAGNOSIS — E118 Type 2 diabetes mellitus with unspecified complications: Secondary | ICD-10-CM | POA: Diagnosis not present

## 2024-01-31 DIAGNOSIS — F419 Anxiety disorder, unspecified: Secondary | ICD-10-CM | POA: Diagnosis not present

## 2024-01-31 DIAGNOSIS — E1169 Type 2 diabetes mellitus with other specified complication: Secondary | ICD-10-CM

## 2024-01-31 DIAGNOSIS — Z8669 Personal history of other diseases of the nervous system and sense organs: Secondary | ICD-10-CM

## 2024-01-31 DIAGNOSIS — Z309 Encounter for contraceptive management, unspecified: Secondary | ICD-10-CM | POA: Insufficient documentation

## 2024-01-31 DIAGNOSIS — K219 Gastro-esophageal reflux disease without esophagitis: Secondary | ICD-10-CM | POA: Diagnosis not present

## 2024-01-31 DIAGNOSIS — Z794 Long term (current) use of insulin: Secondary | ICD-10-CM

## 2024-01-31 DIAGNOSIS — Z3041 Encounter for surveillance of contraceptive pills: Secondary | ICD-10-CM

## 2024-01-31 MED ORDER — BUSPIRONE HCL 5 MG PO TABS: 5.0000 mg | ORAL_TABLET | Freq: Two times a day (BID) | ORAL | 0 refills | Status: DC

## 2024-01-31 MED ORDER — NORGESTIMATE-ETH ESTRADIOL 0.25-35 MG-MCG PO TABS: 1.0000 | ORAL_TABLET | Freq: Every day | ORAL | 5 refills | Status: AC

## 2024-01-31 MED ORDER — PANTOPRAZOLE SODIUM 40 MG PO TBEC: 40.0000 mg | DELAYED_RELEASE_TABLET | Freq: Every day | ORAL | 3 refills | Status: AC

## 2024-01-31 MED ORDER — PIOGLITAZONE HCL 45 MG PO TABS: 45.0000 mg | ORAL_TABLET | Freq: Every day | ORAL | 5 refills | Status: AC

## 2024-01-31 MED ORDER — IBUPROFEN 800 MG PO TABS: 800.0000 mg | ORAL_TABLET | Freq: Three times a day (TID) | ORAL | 1 refills | Status: AC

## 2024-01-31 MED ORDER — METFORMIN HCL 500 MG PO TABS: 500.0000 mg | ORAL_TABLET | Freq: Two times a day (BID) | ORAL | 1 refills | Status: DC

## 2024-01-31 MED ORDER — DAPAGLIFLOZIN PROPANEDIOL 10 MG PO TABS: 10.0000 mg | ORAL_TABLET | Freq: Every day | ORAL | 5 refills | Status: AC

## 2024-01-31 MED ORDER — TIRZEPATIDE 5 MG/0.5ML ~~LOC~~ SOAJ: 5.0000 mg | SUBCUTANEOUS | 3 refills | Status: AC

## 2024-01-31 NOTE — Assessment & Plan Note (Signed)
 She is on Mounjaro  5 mg daily and metformin  500 mg every morning.  Sometimes she takes a second metformin  in the afternoon if her blood sugar is elevated.  She has not seen the eye doctor for retinal exam but knows she needs to do this.

## 2024-01-31 NOTE — Telephone Encounter (Signed)
 Jardiance  is also not a preferred formulary

## 2024-01-31 NOTE — Assessment & Plan Note (Signed)
 Refilled her hormonal contraception.  She cycles every 3 months.  Advised to use condoms in addition to birth control due to the potential reduced efficacy with Mounjaro  use.  Please use condoms in addition to your birth control.

## 2024-01-31 NOTE — Progress Notes (Signed)
 Established Patient Office Visit  Subjective   Patient ID: Brittany White, female    DOB: Feb 26, 1986  Age: 38 y.o. MRN: [id number]  Chief Complaint  Patient presents with   Medical Management of Chronic Issues    HPI delightful 38 year old with DMT2, GERD and anxiety.  Discussed the use of AI scribe software for clinical note transcription with the patient, who gave verbal consent to proceed.  History of Present Illness    She has adjusted her Mounjaro  dose from 7.5 mg to 5.0 mg due to nausea. Her last A1c was 5.6% in May 2025.  She currently takes Mounjaro  5 mL and metformin  as needed, typically in the morning and occasionally in the evening if her BS is elevated.. She feels sleepy with the current Mounjaro  dose but no longer experiences nausea.  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 She takes anxiety medication once daily, Buspar , Monday through Friday, and sometimes on Saturdays. She is considering FMLA to manage her anxiety better.  She uses birth control that results in a menstrual cycle every three months and uses condoms as an additional precaution. Her menstrual cycle is painful and heavy.  She has not yet visited the eye doctor due to scheduling conflicts with her job but is in the process of arranging an appointment.       Objective:     BP 122/85 (BP Location: Left Arm, Patient Position: Sitting, Cuff Size: Normal)   Pulse 81   Temp 98.3 F (36.8 C) (Oral)   Resp 18   Ht 5' 4 (1.626 m)   Wt 200 lb (90.7 kg)   SpO2 99%   BMI 34.33 kg/m    Physical Exam Vitals and nursing note reviewed.  Constitutional:      Appearance: Normal appearance.  HENT:     Head: Normocephalic and atraumatic.  Eyes:     Conjunctiva/sclera: Conjunctivae normal.  Cardiovascular:     Rate and Rhythm: Normal rate and regular rhythm.  Pulmonary:     Effort: Pulmonary  effort is normal.     Breath sounds: Normal breath sounds.  Musculoskeletal:     Right lower leg: No edema.     Left lower leg: No edema.  Skin:    General: Skin is warm and dry.  Neurological:     Mental Status: She is alert and oriented to person, place, and time.  Psychiatric:        Mood and Affect: Mood normal.        Behavior: Behavior normal.        Thought Content: Thought content normal.        Judgment: Judgment normal.          No results found for any visits on 01/31/24.    The ASCVD Risk score (Arnett DK, et al., 2019) failed to calculate for the following reasons:   The 2019 ASCVD risk score is only valid for ages 9 to 52    Assessment & Plan:  DM (diabetes mellitus), type 2 with complications (HCC) Assessment & Plan: She is on Mounjaro  5 mg daily and metformin  500 mg every morning.  Sometimes she takes a second metformin  in the afternoon if her blood sugar is elevated.  She has not seen the eye doctor for retinal exam but knows she needs to do this.  Orders: -  Tirzepatide ; Inject 5 mg into the skin once a week.  Dispense: 6 mL; Refill: 3  Controlled type 2 diabetes mellitus with complication, without long-term current use of insulin  (HCC) -     Dapagliflozin  Propanediol; Take 1 tablet (10 mg total) by mouth daily before breakfast.  Dispense: 30 tablet; Refill: 5 -     metFORMIN  HCl; Take 1 tablet (500 mg total) by mouth 2 (two) times daily with a meal.  Dispense: 180 tablet; Refill: 1  Hx of migraine headaches -     Ibuprofen ; Take 1 tablet (800 mg total) by mouth every 8 (eight) hours.  Dispense: 60 tablet; Refill: 1  Encounter for surveillance of contraceptive pills -     Norgestimate -Eth Estradiol ; Take 1 tablet by mouth daily.  Dispense: 30 tablet; Refill: 5  Type 2 diabetes mellitus with other specified complication, with long-term current use of insulin  (HCC) -     Pioglitazone  HCl; Take 1 tablet (45 mg total) by mouth daily.  Dispense: 30  tablet; Refill: 5 -     Comprehensive metabolic panel with GFR -     Hemoglobin A1c -     Microalbumin / creatinine urine ratio  Gastroesophageal reflux disease, unspecified whether esophagitis present -     Pantoprazole Sodium; Take 1 tablet (40 mg total) by mouth daily.  Dispense: 90 tablet; Refill: 3  DM (diabetes mellitus), type 2 with complications (HCC) Assessment & Plan: She is on Mounjaro  5 mg daily and metformin  500 mg every morning.  Sometimes she takes a second metformin  in the afternoon if her blood sugar is elevated.  She has not seen the eye doctor for retinal exam but knows she needs to do this.  Orders: -     Tirzepatide ; Inject 5 mg into the skin once a week.  Dispense: 6 mL; Refill: 3  Anxiety Assessment & Plan: She takes BuSpar  every morning.  Advise she can take BuSpar  3 times a day if she would like.  It has no addiction potential.  She is considering an FMLA for anxiety  Orders: -     busPIRone  HCl; Take 1 tablet (5 mg total) by mouth 2 (two) times daily.  Dispense: 180 tablet; Refill: 0 -     CBC with Differential/Platelet    Contraceptive management Currently using a contraceptive that results in a cycle every three months. Advised to use condoms in addition to birth control due to potential reduced efficacy with Mounjaro  use. - Continue current contraceptive regimen. - Use condoms in addition to birth control.        Return in about 3 months (around 05/02/2024).    Onetha Gaffey K Emad Brechtel, MD

## 2024-01-31 NOTE — Telephone Encounter (Signed)
 [facility], Farxiga  is not a preferred formulary. Preferred formularies are Metformin , Glipizide, Glyburide  and Pioglitazone .

## 2024-01-31 NOTE — Assessment & Plan Note (Signed)
 She takes BuSpar  every morning.  Advise she can take BuSpar  3 times a day if she would like.  It has no addiction potential.  She is considering an FMLA for anxiety

## 2024-02-02 LAB — COMPREHENSIVE METABOLIC PANEL WITH GFR
ALT: 10 IU/L (ref 0–32)
AST: 14 IU/L (ref 0–40)
Albumin: 4.1 g/dL (ref 3.9–4.9)
Alkaline Phosphatase: 81 IU/L (ref 41–116)
BUN/Creatinine Ratio: 9 (ref 9–23)
BUN: 9 mg/dL (ref 6–20)
Bilirubin Total: 0.4 mg/dL (ref 0.0–1.2)
CO2: 23 mmol/L (ref 20–29)
Calcium: 8.8 mg/dL (ref 8.7–10.2)
Chloride: 103 mmol/L (ref 96–106)
Creatinine, Ser: 1 mg/dL (ref 0.57–1.00)
Globulin, Total: 2.9 g/dL (ref 1.5–4.5)
Glucose: 89 mg/dL (ref 70–99)
Potassium: 4.1 mmol/L (ref 3.5–5.2)
Sodium: 139 mmol/L (ref 134–144)
Total Protein: 7 g/dL (ref 6.0–8.5)
eGFR: 75 mL/min/1.73 (ref >59)

## 2024-02-02 LAB — CBC WITH DIFFERENTIAL/PLATELET
Basophils Absolute: 0 x10E3/uL (ref 0.0–0.2)
Basos: 0 %
EOS (ABSOLUTE): 0 x10E3/uL (ref 0.0–0.4)
Eos: 1 %
Hematocrit: 39.7 % (ref 34.0–46.6)
Hemoglobin: 12.2 g/dL (ref 11.1–15.9)
Immature Grans (Abs): 0 x10E3/uL (ref 0.0–0.1)
Immature Granulocytes: 0 %
Lymphocytes Absolute: 2.1 x10E3/uL (ref 0.7–3.1)
Lymphs: 29 %
MCH: 24.1 pg — ABNORMAL LOW (ref 26.6–33.0)
MCHC: 30.7 g/dL — ABNORMAL LOW (ref 31.5–35.7)
MCV: 79 fL (ref 79–97)
Monocytes Absolute: 0.7 x10E3/uL (ref 0.1–0.9)
Monocytes: 10 %
Neutrophils Absolute: 4.6 x10E3/uL (ref 1.4–7.0)
Neutrophils: 60 %
Platelets: 297 x10E3/uL (ref 150–450)
RBC: 5.06 x10E6/uL (ref 3.77–5.28)
RDW: 14.7 % (ref 11.7–15.4)
WBC: 7.5 x10E3/uL (ref 3.4–10.8)

## 2024-02-02 LAB — MICROALBUMIN / CREATININE URINE RATIO
Creatinine, Urine: 187.5 mg/dL
Microalb/Creat Ratio: 2 mg/g{creat} (ref 0–29)
Microalbumin, Urine: 3.9 ug/mL

## 2024-02-02 LAB — HEMOGLOBIN A1C
Est. average glucose Bld gHb Est-mCnc: 120 mg/dL
Hgb A1c MFr Bld: 5.8 % — ABNORMAL HIGH (ref 4.8–5.6)

## 2024-02-05 ENCOUNTER — Ambulatory Visit: Payer: Self-pay | Admitting: Family Medicine

## 2024-02-09 ENCOUNTER — Telehealth: Payer: Self-pay | Admitting: Family Medicine

## 2024-02-09 NOTE — Telephone Encounter (Signed)
 Called and talked to her about her insurance coverage as Jardiance  and Farxiga  are not covered with her insurance.  Plan to increase her metformin  and make sure that it is the sustained release formulation.

## 2024-02-10 ENCOUNTER — Other Ambulatory Visit: Payer: Self-pay | Admitting: Family Medicine

## 2024-02-10 DIAGNOSIS — E118 Type 2 diabetes mellitus with unspecified complications: Secondary | ICD-10-CM

## 2024-02-10 MED ORDER — METFORMIN HCL ER 500 MG PO TB24: 500.0000 mg | ORAL_TABLET | Freq: Two times a day (BID) | ORAL | 1 refills | Status: AC

## 2024-02-10 NOTE — Telephone Encounter (Signed)
 Advised that Jardiance  and Farxiga  are not covered by her insurance.  Instead lets do metformin  ER 500 mg twice a day.  Follow-up in 90 days.

## 2024-02-21 ENCOUNTER — Telehealth: Payer: Self-pay | Admitting: Family Medicine

## 2024-02-21 NOTE — Telephone Encounter (Unsigned)
 Copied from CRM [id number]. Topic: General - Other >> Feb 21, 2024  3:14 PM Tiffini S wrote: Reason for CRM: Patient faxed FMLA paperwork twice to the office starting on 02/19/24-  FMLA transaction is pending for the leave with employer   Please call the patient at [phone] to discuss

## 2024-02-26 ENCOUNTER — Telehealth: Payer: Self-pay | Admitting: Family Medicine

## 2024-02-26 NOTE — Telephone Encounter (Signed)
 Copied from CRM [id number]. Topic: General - Other >> Feb 21, 2024  3:14 PM Tiffini S wrote: Reason for CRM: Patient faxed FMLA paperwork twice to the office starting on 02/19/24-  FMLA transaction is pending for the leave with employer   Please call the patient at [phone] to discuss

## 2024-03-03 ENCOUNTER — Telehealth: Payer: Self-pay | Admitting: Family Medicine

## 2024-03-03 NOTE — Telephone Encounter (Signed)
 Patient called about her FMLA paperwork; said she was told it is on the doctor's desk. Last OV  01/31/24

## 2024-03-03 NOTE — Telephone Encounter (Signed)
 Communication  Reason for CRM: Patient faxed FMLA paperwork twice to the office starting on 02/19/24-    FMLA transaction is pending for the leave with employer        Please call the patient at [phone] to discuss

## 2024-03-04 ENCOUNTER — Telehealth: Payer: Self-pay | Admitting: Family Medicine

## 2024-03-04 NOTE — Telephone Encounter (Signed)
 Copied from CRM #[id number]. Topic: General - Other >> Mar 04, 2024 12:29 PM Delon DASEN wrote: Reason for CRM: Patient is having someone drop off the forms for her FMLA - she did send the pdf in [place]

## 2024-04-17 ENCOUNTER — Telehealth: Payer: Self-pay | Admitting: *Deleted

## 2024-04-17 DIAGNOSIS — Z0279 Encounter for issue of other medical certificate: Secondary | ICD-10-CM

## 2024-04-20 ENCOUNTER — Telehealth: Payer: Self-pay | Admitting: Family Medicine

## 2024-04-20 NOTE — Telephone Encounter (Signed)
 Called and left message for pt. That the Family and Medical Leave form has been completed and ready for pick-up.

## 2024-04-20 NOTE — Telephone Encounter (Signed)
 Pt. Made aware.

## 2024-04-21 ENCOUNTER — Telehealth: Payer: Self-pay | Admitting: Family Medicine

## 2024-04-21 NOTE — Telephone Encounter (Signed)
 Patient walked in and picked up [facility] paperwork

## 2024-05-08 ENCOUNTER — Other Ambulatory Visit: Payer: Self-pay | Admitting: Family Medicine

## 2024-05-08 DIAGNOSIS — F419 Anxiety disorder, unspecified: Secondary | ICD-10-CM
